# Patient Record
Sex: Female | Born: 1950 | Race: White | Hispanic: No | Smoking: Current every day smoker
Health system: Southern US, Community
[De-identification: ages and names within clinical notes are randomized; demographics above are authoritative.]

## PROBLEM LIST (undated history)

## (undated) DIAGNOSIS — E039 Hypothyroidism, unspecified: Secondary | ICD-10-CM

## (undated) DIAGNOSIS — J449 Chronic obstructive pulmonary disease, unspecified: Secondary | ICD-10-CM

## (undated) DIAGNOSIS — R202 Paresthesia of skin: Secondary | ICD-10-CM

## (undated) DIAGNOSIS — R51 Headache: Secondary | ICD-10-CM

## (undated) DIAGNOSIS — R200 Anesthesia of skin: Secondary | ICD-10-CM

## (undated) DIAGNOSIS — F32A Depression, unspecified: Secondary | ICD-10-CM

## (undated) DIAGNOSIS — I10 Essential (primary) hypertension: Secondary | ICD-10-CM

## (undated) DIAGNOSIS — F329 Major depressive disorder, single episode, unspecified: Secondary | ICD-10-CM

## (undated) DIAGNOSIS — J45909 Unspecified asthma, uncomplicated: Secondary | ICD-10-CM

## (undated) HISTORY — PX: CERVICAL FUSION: SHX112

## (undated) HISTORY — PX: OTHER SURGICAL HISTORY: SHX169

## (undated) HISTORY — PX: CARPAL TUNNEL RELEASE: SHX101

---

## 2013-12-15 ENCOUNTER — Other Ambulatory Visit: Payer: Self-pay | Admitting: Orthopedic Surgery

## 2013-12-22 ENCOUNTER — Encounter (HOSPITAL_COMMUNITY): Payer: Self-pay | Admitting: Pharmacy Technician

## 2013-12-29 ENCOUNTER — Ambulatory Visit (HOSPITAL_COMMUNITY)
Admission: RE | Admit: 2013-12-29 | Discharge: 2013-12-29 | Disposition: A | Discharge status: Home / Self Care | Payer: Worker's Compensation | Source: Ambulatory Visit | Attending: Orthopedic Surgery | Admitting: Orthopedic Surgery

## 2013-12-29 ENCOUNTER — Encounter (HOSPITAL_COMMUNITY): Payer: Self-pay

## 2013-12-29 ENCOUNTER — Encounter (HOSPITAL_COMMUNITY)
Admission: RE | Admit: 2013-12-29 | Discharge: 2013-12-29 | Disposition: A | Discharge status: Home / Self Care | Payer: Worker's Compensation | Source: Ambulatory Visit | Attending: Orthopedic Surgery | Admitting: Orthopedic Surgery

## 2013-12-29 DIAGNOSIS — Z01810 Encounter for preprocedural cardiovascular examination: Secondary | ICD-10-CM | POA: Insufficient documentation

## 2013-12-29 DIAGNOSIS — Z01818 Encounter for other preprocedural examination: Secondary | ICD-10-CM | POA: Diagnosis present

## 2013-12-29 DIAGNOSIS — Z01812 Encounter for preprocedural laboratory examination: Secondary | ICD-10-CM | POA: Insufficient documentation

## 2013-12-29 HISTORY — DX: Anesthesia of skin: R20.2

## 2013-12-29 HISTORY — DX: Unspecified asthma, uncomplicated: J45.909

## 2013-12-29 HISTORY — DX: Major depressive disorder, single episode, unspecified: F32.9

## 2013-12-29 HISTORY — DX: Headache: R51

## 2013-12-29 HISTORY — DX: Hypothyroidism, unspecified: E03.9

## 2013-12-29 HISTORY — DX: Chronic obstructive pulmonary disease, unspecified: J44.9

## 2013-12-29 HISTORY — DX: Essential (primary) hypertension: I10

## 2013-12-29 HISTORY — DX: Depression, unspecified: F32.A

## 2013-12-29 HISTORY — DX: Anesthesia of skin: R20.0

## 2013-12-29 LAB — URINALYSIS, ROUTINE W REFLEX MICROSCOPIC
Bilirubin Urine: NEGATIVE
Glucose, UA: NEGATIVE mg/dL
Hgb urine dipstick: NEGATIVE
KETONES UR: NEGATIVE mg/dL
LEUKOCYTES UA: NEGATIVE
NITRITE: NEGATIVE
PROTEIN: NEGATIVE mg/dL
Specific Gravity, Urine: 1.014 (ref 1.005–1.030)
UROBILINOGEN UA: 0.2 mg/dL (ref 0.0–1.0)
pH: 7 (ref 5.0–8.0)

## 2013-12-29 LAB — TYPE AND SCREEN
ABO/RH(D): A NEG
ANTIBODY SCREEN: NEGATIVE

## 2013-12-29 LAB — CBC WITH DIFFERENTIAL/PLATELET
BASOS ABS: 0.1 10*3/uL (ref 0.0–0.1)
BASOS PCT: 1 % (ref 0–1)
Eosinophils Absolute: 0.1 10*3/uL (ref 0.0–0.7)
Eosinophils Relative: 1 % (ref 0–5)
HCT: 41.2 % (ref 36.0–46.0)
Hemoglobin: 14.2 g/dL (ref 12.0–15.0)
Lymphocytes Relative: 34 % (ref 12–46)
Lymphs Abs: 2.9 10*3/uL (ref 0.7–4.0)
MCH: 31.6 pg (ref 26.0–34.0)
MCHC: 34.5 g/dL (ref 30.0–36.0)
MCV: 91.8 fL (ref 78.0–100.0)
MONO ABS: 0.3 10*3/uL (ref 0.1–1.0)
Monocytes Relative: 4 % (ref 3–12)
NEUTROS ABS: 5.2 10*3/uL (ref 1.7–7.7)
NEUTROS PCT: 60 % (ref 43–77)
Platelets: 262 10*3/uL (ref 150–400)
RBC: 4.49 MIL/uL (ref 3.87–5.11)
RDW: 14.9 % (ref 11.5–15.5)
WBC: 8.6 10*3/uL (ref 4.0–10.5)

## 2013-12-29 LAB — COMPREHENSIVE METABOLIC PANEL
ALBUMIN: 3.9 g/dL (ref 3.5–5.2)
ALT: 7 U/L (ref 0–35)
AST: 12 U/L (ref 0–37)
Alkaline Phosphatase: 87 U/L (ref 39–117)
BUN: 7 mg/dL (ref 6–23)
CO2: 24 mEq/L (ref 19–32)
CREATININE: 0.64 mg/dL (ref 0.50–1.10)
Calcium: 10 mg/dL (ref 8.4–10.5)
Chloride: 98 mEq/L (ref 96–112)
GFR calc Af Amer: >90 mL/min (ref >90)
GFR calc non Af Amer: >90 mL/min (ref >90)
Glucose, Bld: 96 mg/dL (ref 70–99)
POTASSIUM: 4.2 meq/L (ref 3.7–5.3)
Sodium: 137 mEq/L (ref 137–147)
Total Bilirubin: 0.4 mg/dL (ref 0.3–1.2)
Total Protein: 7.5 g/dL (ref 6.0–8.3)

## 2013-12-29 LAB — APTT: aPTT: 26 seconds (ref 24–37)

## 2013-12-29 LAB — ABO/RH: ABO/RH(D): A NEG

## 2013-12-29 LAB — PROTIME-INR
INR: 1.01 (ref 0.00–1.49)
Prothrombin Time: 13.1 seconds (ref 11.6–15.2)

## 2013-12-29 NOTE — Progress Notes (Signed)
Pt chart forwarded to [place], [place] ( anesthesia) to review EKG.

## 2013-12-29 NOTE — Pre-Procedure Instructions (Addendum)
Shelby GoodellJudy Wade  12/29/2013   Your procedure is scheduled on: Thursday, January 06, 2014 at 7:30 AM  [facility] to [facility] (use Main Entrance "A"') at 5:30 AM.  Call this number if you have problems the morning of surgery: [phone]   Remember:   Do not eat food or drink liquids after midnight Wednesday, January 05, 2014   Take these medicines the morning of surgery with A SIP OF WATER:  levothyroxine (SYNTHROID, LEVOTHROID, OxyCODONE, if needed:albuterol (PROVENTIL HFA;VENTOLIN HFA) for wheezing or shortness of breath ( Bring inhaler with you on day of procedure).  Stop taking Aspirin and herbal medications. Do not take any NSAIDs ie: Ibuprofen, Advil, Naproxen or any medication containing Aspirin. Stop 5 days prior to procedure. Saturday 01/01/14   Do not wear jewelry, make-up or nail polish.  Do not wear lotions, powders, or perfumes. You may  NOT wear deodorant.  Do not shave 48 hours prior to surgery.   Do not bring valuables to the hospital.  [facility] is not responsible for any belongings or valuables.               Contacts, dentures or bridgework may not be worn into surgery.  Leave suitcase in the car. After surgery it may be brought to your room.  For patients admitted to the hospital, discharge time is determined by your treatment team.               Patients discharged the day of surgery will not be allowed to drive home.  Name and phone number of your driver:  Special Instructions:  Special Instructions:Special Instructions: [facility] - Preparing for [facility]  Before surgery, you can play an important role.  Because skin is not sterile, your skin needs to be as free of germs as possible.  You can reduce the number of germs on you skin by washing with CHG (chlorahexidine gluconate) soap before surgery.  CHG is an antiseptic cleaner which kills germs and bonds with the skin to continue killing germs even after washing.  Please DO NOT use if you have an allergy to CHG  or antibacterial soaps.  If your skin becomes reddened/irritated stop using the CHG and inform your nurse when you arrive at Short Stay.  Do not shave (including legs and underarms) for at least 48 hours prior to the first CHG shower.  You may shave your face.  Please follow these instructions carefully:   1.  Shower with CHG Soap the night before surgery and the morning of Surgery.  2.  If you choose to wash your hair, wash your hair first as usual with your normal shampoo.  3.  After you shampoo, rinse your hair and body thoroughly to remove the Shampoo.  4.  Use CHG as you would any other liquid soap.  You can apply chg directly  to the skin and wash gently with scrungie or a clean washcloth.  5.  Apply the CHG Soap to your body ONLY FROM THE NECK DOWN.  Do not use on open wounds or open sores.  Avoid contact with your eyes, ears, mouth and genitals (private parts).  Wash genitals (private parts) with your normal soap.  6.  Wash thoroughly, paying special attention to the area where your surgery will be performed.  7.  Thoroughly rinse your body with warm water from the neck down.  8.  DO NOT shower/wash with your normal soap after using and rinsing off the CHG Soap.  9.  Pat yourself dry with a clean towel.            10.  Wear clean pajamas.            11.  Place clean sheets on your bed the night of your first shower and do not sleep with pets.  Day of Surgery  Do not apply any lotions/deodorants the morning of surgery.  Please wear clean clothes to the hospital/surgery center.   Please read over the following fact sheets that you were given: Pain Booklet, Coughing and Deep Breathing, Blood Transfusion Information and Surgical Site Infection Prevention

## 2013-12-30 NOTE — Progress Notes (Signed)
Anesthesia chart review: Patient is a 63 year old female scheduled for left reverse total shoulder on 01/06/14 by Dr. Ave Filterhandler. History includes hypothyroidism, hypertension, COPD, smoking, asthma, depression, headaches "cluster migraines", cervical fusion, bilateral CTR and ulnar nerve repositioning, paresthesias in her extremities. BMI is consistent with obesity.  EKG on 12/29/13 showed NSR with first degree AVB. She reported an echocardiogram in the past, but > 20 years ago.  Preoperative CXR and labs noted.  Anticipate that she can proceed as planned.  Velna Ochsllison Rece Zechman, PA-C [facility]/Anesthesiology Phone [phone] 12/30/2013 12:33 PM

## 2014-01-05 MED ORDER — POVIDONE-IODINE 7.5 % EX SOLN
Freq: Once | CUTANEOUS | Status: DC
  Filled 2014-01-05: qty 118

## 2014-01-05 MED ORDER — VANCOMYCIN HCL IN DEXTROSE 1-5 GM/200ML-% IV SOLN
1000.0000 mg | Freq: Once | INTRAVENOUS | Status: AC
  Administered 2014-01-06: 1000 mg via INTRAVENOUS
  Filled 2014-01-05: qty 200

## 2014-01-05 MED ORDER — CEFAZOLIN SODIUM-DEXTROSE 2-3 GM-% IV SOLR: 2.0000 g | INTRAVENOUS | Status: DC

## 2014-01-05 NOTE — Progress Notes (Signed)
Patient called regarding oxycodone stated she normally takes it at 6p/ 6a. I advised her she could take it at 0500 or 0515 in the morning with a sip of water.

## 2014-01-06 ENCOUNTER — Encounter (HOSPITAL_COMMUNITY): Payer: Worker's Compensation | Admitting: Vascular Surgery

## 2014-01-06 ENCOUNTER — Encounter (HOSPITAL_COMMUNITY)
Admission: RE | Disposition: A | Discharge status: Skilled Nursing Facility | Payer: Self-pay | Source: Ambulatory Visit | Attending: Orthopedic Surgery

## 2014-01-06 ENCOUNTER — Inpatient Hospital Stay (HOSPITAL_COMMUNITY): Payer: Worker's Compensation | Admitting: Anesthesiology

## 2014-01-06 ENCOUNTER — Inpatient Hospital Stay (HOSPITAL_COMMUNITY): Payer: Worker's Compensation

## 2014-01-06 ENCOUNTER — Inpatient Hospital Stay (HOSPITAL_COMMUNITY)
Admission: RE | Admit: 2014-01-06 | Discharge: 2014-01-11 | DRG: 483 | Disposition: A | Discharge status: Skilled Nursing Facility | Payer: Worker's Compensation | Source: Ambulatory Visit | Attending: Orthopedic Surgery | Admitting: Orthopedic Surgery

## 2014-01-06 ENCOUNTER — Encounter (HOSPITAL_COMMUNITY): Payer: Self-pay | Admitting: Surgery

## 2014-01-06 DIAGNOSIS — Z880 Allergy status to penicillin: Secondary | ICD-10-CM

## 2014-01-06 DIAGNOSIS — I10 Essential (primary) hypertension: Secondary | ICD-10-CM | POA: Diagnosis present

## 2014-01-06 DIAGNOSIS — F17200 Nicotine dependence, unspecified, uncomplicated: Secondary | ICD-10-CM | POA: Diagnosis present

## 2014-01-06 DIAGNOSIS — Z8249 Family history of ischemic heart disease and other diseases of the circulatory system: Secondary | ICD-10-CM

## 2014-01-06 DIAGNOSIS — M719 Bursopathy, unspecified: Principal | ICD-10-CM | POA: Diagnosis present

## 2014-01-06 DIAGNOSIS — M75100 Unspecified rotator cuff tear or rupture of unspecified shoulder, not specified as traumatic: Secondary | ICD-10-CM

## 2014-01-06 DIAGNOSIS — Z881 Allergy status to other antibiotic agents status: Secondary | ICD-10-CM

## 2014-01-06 DIAGNOSIS — G43909 Migraine, unspecified, not intractable, without status migrainosus: Secondary | ICD-10-CM | POA: Diagnosis present

## 2014-01-06 DIAGNOSIS — M19019 Primary osteoarthritis, unspecified shoulder: Secondary | ICD-10-CM | POA: Diagnosis present

## 2014-01-06 DIAGNOSIS — E039 Hypothyroidism, unspecified: Secondary | ICD-10-CM | POA: Diagnosis present

## 2014-01-06 DIAGNOSIS — M12819 Other specific arthropathies, not elsewhere classified, unspecified shoulder: Secondary | ICD-10-CM | POA: Diagnosis present

## 2014-01-06 DIAGNOSIS — J4489 Other specified chronic obstructive pulmonary disease: Secondary | ICD-10-CM | POA: Diagnosis present

## 2014-01-06 DIAGNOSIS — J449 Chronic obstructive pulmonary disease, unspecified: Secondary | ICD-10-CM | POA: Diagnosis present

## 2014-01-06 DIAGNOSIS — M67919 Unspecified disorder of synovium and tendon, unspecified shoulder: Principal | ICD-10-CM | POA: Diagnosis present

## 2014-01-06 HISTORY — PX: REVERSE SHOULDER ARTHROPLASTY: SHX5054

## 2014-01-06 SURGERY — ARTHROPLASTY, SHOULDER, TOTAL, REVERSE
Anesthesia: General | Site: Shoulder | Laterality: Left

## 2014-01-06 MED ORDER — PREGABALIN 50 MG PO CAPS
150.0000 mg | ORAL_CAPSULE | Freq: Two times a day (BID) | ORAL | Status: DC
  Administered 2014-01-06 – 2014-01-11 (×10): 150 mg via ORAL
  Filled 2014-01-06 (×11): qty 3

## 2014-01-06 MED ORDER — LISINOPRIL 10 MG PO TABS
10.0000 mg | ORAL_TABLET | Freq: Every day | ORAL | Status: DC
  Administered 2014-01-07 – 2014-01-11 (×3): 10 mg via ORAL
  Filled 2014-01-06 (×6): qty 1

## 2014-01-06 MED ORDER — OXYCODONE HCL 5 MG PO TABS: 5.0000 mg | ORAL_TABLET | Freq: Once | ORAL | Status: DC | PRN

## 2014-01-06 MED ORDER — FENTANYL CITRATE 0.05 MG/ML IJ SOLN
INTRAMUSCULAR | Status: DC | PRN
  Administered 2014-01-06 (×2): 50 ug via INTRAVENOUS

## 2014-01-06 MED ORDER — ACETAMINOPHEN 325 MG PO TABS
650.0000 mg | ORAL_TABLET | Freq: Four times a day (QID) | ORAL | Status: DC | PRN
  Filled 2014-01-06: qty 2

## 2014-01-06 MED ORDER — OXYCODONE-ACETAMINOPHEN 5-325 MG PO TABS
1.0000 | ORAL_TABLET | ORAL | Status: DC | PRN
  Administered 2014-01-06 – 2014-01-11 (×10): 2 via ORAL
  Filled 2014-01-06 (×10): qty 2

## 2014-01-06 MED ORDER — OXYCODONE HCL ER 20 MG PO T12A
60.0000 mg | EXTENDED_RELEASE_TABLET | Freq: Two times a day (BID) | ORAL | Status: DC
  Administered 2014-01-06 – 2014-01-11 (×10): 60 mg via ORAL
  Filled 2014-01-06 (×6): qty 3
  Filled 2014-01-06: qty 6
  Filled 2014-01-06 (×3): qty 3

## 2014-01-06 MED ORDER — OXYCODONE HCL ER 20 MG PO T12A: 60.0000 mg | EXTENDED_RELEASE_TABLET | Freq: Two times a day (BID) | ORAL | Status: DC

## 2014-01-06 MED ORDER — MORPHINE SULFATE 2 MG/ML IJ SOLN
1.0000 mg | INTRAMUSCULAR | Status: DC | PRN
  Administered 2014-01-06 – 2014-01-07 (×3): 1 mg via INTRAVENOUS
  Filled 2014-01-06 (×2): qty 1

## 2014-01-06 MED ORDER — MEPERIDINE HCL 25 MG/ML IJ SOLN: 6.2500 mg | INTRAMUSCULAR | Status: DC | PRN

## 2014-01-06 MED ORDER — DEXTROSE 5 % IV SOLN
10.0000 mg | INTRAVENOUS | Status: DC | PRN
  Administered 2014-01-06: 40 ug/min via INTRAVENOUS

## 2014-01-06 MED ORDER — ALBUTEROL SULFATE (2.5 MG/3ML) 0.083% IN NEBU
2.5000 mg | INHALATION_SOLUTION | Freq: Four times a day (QID) | RESPIRATORY_TRACT | Status: DC | PRN
  Administered 2014-01-06: 2.5 mg via RESPIRATORY_TRACT

## 2014-01-06 MED ORDER — ONDANSETRON HCL 4 MG/2ML IJ SOLN: 4.0000 mg | Freq: Once | INTRAMUSCULAR | Status: DC | PRN

## 2014-01-06 MED ORDER — VANCOMYCIN HCL IN DEXTROSE 1-5 GM/200ML-% IV SOLN
1000.0000 mg | Freq: Two times a day (BID) | INTRAVENOUS | Status: AC
  Administered 2014-01-06: 1000 mg via INTRAVENOUS
  Filled 2014-01-06: qty 200

## 2014-01-06 MED ORDER — LISINOPRIL-HYDROCHLOROTHIAZIDE 10-12.5 MG PO TABS: 1.0000 | ORAL_TABLET | Freq: Every day | ORAL | Status: DC

## 2014-01-06 MED ORDER — ONDANSETRON HCL 4 MG/2ML IJ SOLN
INTRAMUSCULAR | Status: AC
  Filled 2014-01-06: qty 2

## 2014-01-06 MED ORDER — PROPOFOL 10 MG/ML IV BOLUS
INTRAVENOUS | Status: DC | PRN
  Administered 2014-01-06: 140 mg via INTRAVENOUS
  Administered 2014-01-06: 20 mg via INTRAVENOUS

## 2014-01-06 MED ORDER — ROCURONIUM BROMIDE 100 MG/10ML IV SOLN
INTRAVENOUS | Status: DC | PRN
  Administered 2014-01-06: 50 mg via INTRAVENOUS

## 2014-01-06 MED ORDER — HYDROMORPHONE HCL PF 1 MG/ML IJ SOLN: 0.2500 mg | INTRAMUSCULAR | Status: DC | PRN

## 2014-01-06 MED ORDER — NICOTINE 7 MG/24HR TD PT24
7.0000 mg | MEDICATED_PATCH | Freq: Every day | TRANSDERMAL | Status: DC
  Administered 2014-01-06 – 2014-01-11 (×6): 7 mg via TRANSDERMAL
  Filled 2014-01-06 (×6): qty 1

## 2014-01-06 MED ORDER — OXYCODONE HCL ER 60 MG PO T12A: 60.0000 mg | EXTENDED_RELEASE_TABLET | Freq: Two times a day (BID) | ORAL | Status: DC

## 2014-01-06 MED ORDER — PROPOFOL 10 MG/ML IV BOLUS
INTRAVENOUS | Status: AC
Start: 2014-01-06 — End: 2014-01-06
  Filled 2014-01-06: qty 20

## 2014-01-06 MED ORDER — LIDOCAINE HCL (CARDIAC) 20 MG/ML IV SOLN
INTRAVENOUS | Status: AC
  Filled 2014-01-06: qty 5

## 2014-01-06 MED ORDER — NEOSTIGMINE METHYLSULFATE 1 MG/ML IJ SOLN
INTRAMUSCULAR | Status: DC | PRN
  Administered 2014-01-06: 4 mg via INTRAVENOUS

## 2014-01-06 MED ORDER — ALBUTEROL SULFATE (2.5 MG/3ML) 0.083% IN NEBU
2.5000 mg | INHALATION_SOLUTION | Freq: Four times a day (QID) | RESPIRATORY_TRACT | Status: DC | PRN
  Administered 2014-01-09 – 2014-01-10 (×4): 2.5 mg via RESPIRATORY_TRACT
  Filled 2014-01-06 (×5): qty 3

## 2014-01-06 MED ORDER — GLYCOPYRROLATE 0.2 MG/ML IJ SOLN
INTRAMUSCULAR | Status: AC
  Filled 2014-01-06: qty 3

## 2014-01-06 MED ORDER — BISACODYL 10 MG RE SUPP: 10.0000 mg | Freq: Every day | RECTAL | Status: DC | PRN

## 2014-01-06 MED ORDER — MIDAZOLAM HCL 2 MG/2ML IJ SOLN
INTRAMUSCULAR | Status: AC
  Filled 2014-01-06: qty 2

## 2014-01-06 MED ORDER — HYDROCODONE-ACETAMINOPHEN 5-325 MG PO TABS
1.0000 | ORAL_TABLET | ORAL | Status: DC | PRN
  Administered 2014-01-07 – 2014-01-10 (×2): 2 via ORAL
  Filled 2014-01-06 (×2): qty 2

## 2014-01-06 MED ORDER — ALBUTEROL SULFATE HFA 108 (90 BASE) MCG/ACT IN AERS
INHALATION_SPRAY | RESPIRATORY_TRACT | Status: DC | PRN
  Administered 2014-01-06: 2 via RESPIRATORY_TRACT

## 2014-01-06 MED ORDER — FENTANYL CITRATE 0.05 MG/ML IJ SOLN
INTRAMUSCULAR | Status: AC
  Filled 2014-01-06: qty 5

## 2014-01-06 MED ORDER — LIDOCAINE HCL (CARDIAC) 20 MG/ML IV SOLN
INTRAVENOUS | Status: DC | PRN
  Administered 2014-01-06: 50 mg via INTRAVENOUS

## 2014-01-06 MED ORDER — LACTATED RINGERS IV SOLN
INTRAVENOUS | Status: DC | PRN
  Administered 2014-01-06 (×2): via INTRAVENOUS

## 2014-01-06 MED ORDER — SODIUM CHLORIDE 0.9 % IV SOLN
INTRAVENOUS | Status: DC
  Administered 2014-01-06: 100 mL/h via INTRAVENOUS

## 2014-01-06 MED ORDER — OXYCODONE HCL 5 MG/5ML PO SOLN: 5.0000 mg | Freq: Once | ORAL | Status: DC | PRN

## 2014-01-06 MED ORDER — LEVOTHYROXINE SODIUM 175 MCG PO TABS
175.0000 ug | ORAL_TABLET | Freq: Every day | ORAL | Status: DC
  Administered 2014-01-07 – 2014-01-11 (×5): 175 ug via ORAL
  Filled 2014-01-06 (×6): qty 1

## 2014-01-06 MED ORDER — ROCURONIUM BROMIDE 50 MG/5ML IV SOLN
INTRAVENOUS | Status: AC
  Filled 2014-01-06: qty 1

## 2014-01-06 MED ORDER — ONDANSETRON HCL 4 MG/2ML IJ SOLN
INTRAMUSCULAR | Status: DC | PRN
  Administered 2014-01-06: 4 mg via INTRAVENOUS

## 2014-01-06 MED ORDER — POLYETHYLENE GLYCOL 3350 17 G PO PACK
17.0000 g | PACK | Freq: Every day | ORAL | Status: DC | PRN
  Administered 2014-01-08: 17 g via ORAL
  Filled 2014-01-06: qty 1

## 2014-01-06 MED ORDER — ALBUTEROL SULFATE (2.5 MG/3ML) 0.083% IN NEBU
INHALATION_SOLUTION | RESPIRATORY_TRACT | Status: AC
  Administered 2014-01-06: 11:00:00
  Filled 2014-01-06: qty 3

## 2014-01-06 MED ORDER — ONDANSETRON HCL 4 MG/2ML IJ SOLN: 4.0000 mg | Freq: Four times a day (QID) | INTRAMUSCULAR | Status: DC | PRN

## 2014-01-06 MED ORDER — MIDAZOLAM HCL 5 MG/5ML IJ SOLN
INTRAMUSCULAR | Status: DC | PRN
  Administered 2014-01-06 (×2): 1 mg via INTRAVENOUS

## 2014-01-06 MED ORDER — DIPHENHYDRAMINE HCL 12.5 MG/5ML PO ELIX: 12.5000 mg | ORAL_SOLUTION | ORAL | Status: DC | PRN

## 2014-01-06 MED ORDER — DEXTROSE 5 % IV SOLN
INTRAVENOUS | Status: DC | PRN
  Administered 2014-01-06: 08:00:00 via INTRAVENOUS

## 2014-01-06 MED ORDER — HYDROCHLOROTHIAZIDE 12.5 MG PO CAPS
12.5000 mg | ORAL_CAPSULE | Freq: Every day | ORAL | Status: DC
  Administered 2014-01-07 – 2014-01-11 (×4): 12.5 mg via ORAL
  Filled 2014-01-06 (×6): qty 1

## 2014-01-06 MED ORDER — GLYCOPYRROLATE 0.2 MG/ML IJ SOLN
INTRAMUSCULAR | Status: DC | PRN
  Administered 2014-01-06: .15 mg via INTRAVENOUS
  Administered 2014-01-06: 0.6 mg via INTRAVENOUS

## 2014-01-06 MED ORDER — NEOSTIGMINE METHYLSULFATE 1 MG/ML IJ SOLN
INTRAMUSCULAR | Status: AC
  Filled 2014-01-06: qty 10

## 2014-01-06 MED ORDER — OXYCODONE HCL 5 MG PO TABS
5.0000 mg | ORAL_TABLET | ORAL | Status: DC | PRN
  Administered 2014-01-06 – 2014-01-11 (×19): 10 mg via ORAL
  Filled 2014-01-06 (×19): qty 2

## 2014-01-06 MED ORDER — ALUMINUM HYDROXIDE GEL 320 MG/5ML PO SUSP
15.0000 mL | ORAL | Status: DC | PRN
  Filled 2014-01-06: qty 30

## 2014-01-06 MED ORDER — DOCUSATE SODIUM 100 MG PO CAPS
100.0000 mg | ORAL_CAPSULE | Freq: Two times a day (BID) | ORAL | Status: DC
  Administered 2014-01-06 – 2014-01-11 (×11): 100 mg via ORAL
  Filled 2014-01-06 (×14): qty 1

## 2014-01-06 MED ORDER — MENTHOL 3 MG MT LOZG: 1.0000 | LOZENGE | OROMUCOSAL | Status: DC | PRN

## 2014-01-06 MED ORDER — METOCLOPRAMIDE HCL 5 MG/ML IJ SOLN: 5.0000 mg | Freq: Three times a day (TID) | INTRAMUSCULAR | Status: DC | PRN

## 2014-01-06 MED ORDER — ARTIFICIAL TEARS OP OINT
TOPICAL_OINTMENT | OPHTHALMIC | Status: DC | PRN
  Administered 2014-01-06: 1 via OPHTHALMIC

## 2014-01-06 MED ORDER — ASPIRIN EC 325 MG PO TBEC
325.0000 mg | DELAYED_RELEASE_TABLET | Freq: Two times a day (BID) | ORAL | Status: DC
Start: 2014-01-06 — End: 2014-01-11
  Administered 2014-01-06 – 2014-01-11 (×10): 325 mg via ORAL
  Filled 2014-01-06 (×12): qty 1

## 2014-01-06 MED ORDER — SODIUM CHLORIDE 0.9 % IR SOLN
Status: DC | PRN
  Administered 2014-01-06: 3000 mL

## 2014-01-06 MED ORDER — ARTIFICIAL TEARS OP OINT
TOPICAL_OINTMENT | OPHTHALMIC | Status: AC
  Filled 2014-01-06: qty 3.5

## 2014-01-06 MED ORDER — BUPIVACAINE-EPINEPHRINE PF 0.5-1:200000 % IJ SOLN
INTRAMUSCULAR | Status: DC | PRN
  Administered 2014-01-06: 30 mL via PERINEURAL

## 2014-01-06 MED ORDER — METOCLOPRAMIDE HCL 10 MG PO TABS: 5.0000 mg | ORAL_TABLET | Freq: Three times a day (TID) | ORAL | Status: DC | PRN

## 2014-01-06 MED ORDER — PHENOL 1.4 % MT LIQD: 1.0000 | OROMUCOSAL | Status: DC | PRN

## 2014-01-06 MED ORDER — ACETAMINOPHEN 650 MG RE SUPP: 650.0000 mg | Freq: Four times a day (QID) | RECTAL | Status: DC | PRN

## 2014-01-06 MED ORDER — ONDANSETRON HCL 4 MG PO TABS: 4.0000 mg | ORAL_TABLET | Freq: Four times a day (QID) | ORAL | Status: DC | PRN

## 2014-01-06 SURGICAL SUPPLY — 72 items
BIT DRILL 170X2.5X (BIT) IMPLANT
BIT DRL 170X2.5X (BIT)
BLADE SAW SAG 73X25 THK (BLADE) ×1
BLADE SAW SGTL 73X25 THK (BLADE) ×1 IMPLANT
BLADE SURG 15 STRL LF DISP TIS (BLADE) ×1 IMPLANT
BLADE SURG 15 STRL SS (BLADE) ×1
BOWL SMART MIX CTS (DISPOSABLE) IMPLANT
CAPT SHOULD DELTAXTEND CEM MOD ×2 IMPLANT
CHLORAPREP W/TINT 26ML (MISCELLANEOUS) ×2 IMPLANT
CLOSURE STERI-STRIP 1/4X4 (GAUZE/BANDAGES/DRESSINGS) ×2 IMPLANT
COVER SURGICAL LIGHT HANDLE (MISCELLANEOUS) ×2 IMPLANT
DRAPE INCISE IOBAN 66X45 STRL (DRAPES) ×4 IMPLANT
DRAPE SURG 17X23 STRL (DRAPES) ×2 IMPLANT
DRAPE U-SHAPE 47X51 STRL (DRAPES) ×2 IMPLANT
DRILL 2.5 (BIT)
DRILL BIT 5/64 (BIT) ×2 IMPLANT
DRSG MEPILEX BORDER 4X4 (GAUZE/BANDAGES/DRESSINGS) ×2 IMPLANT
DRSG MEPILEX BORDER 4X8 (GAUZE/BANDAGES/DRESSINGS) ×2 IMPLANT
ELECT BLADE 4.0 EZ CLEAN MEGAD (MISCELLANEOUS) ×2
ELECT REM PT RETURN 9FT ADLT (ELECTROSURGICAL) ×2
ELECTRODE BLDE 4.0 EZ CLN MEGD (MISCELLANEOUS) ×1 IMPLANT
ELECTRODE REM PT RTRN 9FT ADLT (ELECTROSURGICAL) ×1 IMPLANT
EVACUATOR 1/8 PVC DRAIN (DRAIN) ×2 IMPLANT
GLOVE BIO SURGEON STRL SZ7 (GLOVE) ×2 IMPLANT
GLOVE BIO SURGEON STRL SZ7.5 (GLOVE) ×2 IMPLANT
GLOVE BIOGEL PI IND STRL 7.0 (GLOVE) ×1 IMPLANT
GLOVE BIOGEL PI IND STRL 8 (GLOVE) ×1 IMPLANT
GLOVE BIOGEL PI INDICATOR 7.0 (GLOVE) ×1
GLOVE BIOGEL PI INDICATOR 8 (GLOVE) ×1
GOWN STRL REUS W/ TWL LRG LVL3 (GOWN DISPOSABLE) ×3 IMPLANT
GOWN STRL REUS W/ TWL XL LVL3 (GOWN DISPOSABLE) ×1 IMPLANT
GOWN STRL REUS W/TWL LRG LVL3 (GOWN DISPOSABLE) ×3
GOWN STRL REUS W/TWL XL LVL3 (GOWN DISPOSABLE) ×1
HANDPIECE INTERPULSE COAX TIP (DISPOSABLE) ×1
HOOD PEEL AWAY FACE SHEILD DIS (HOOD) ×4 IMPLANT
KIT BASIN OR (CUSTOM PROCEDURE TRAY) ×2 IMPLANT
KIT ROOM TURNOVER OR (KITS) ×2 IMPLANT
MANIFOLD NEPTUNE II (INSTRUMENTS) ×2 IMPLANT
NEEDLE HYPO 25GX1X1/2 BEV (NEEDLE) IMPLANT
NEEDLE MAYO TROCAR (NEEDLE) IMPLANT
NOZZLE PRISM 8.5MM (MISCELLANEOUS) IMPLANT
NS IRRIG 1000ML POUR BTL (IV SOLUTION) ×2 IMPLANT
PACK SHOULDER (CUSTOM PROCEDURE TRAY) ×2 IMPLANT
PAD ARMBOARD 7.5X6 YLW CONV (MISCELLANEOUS) ×4 IMPLANT
PIN GUIDE 1.2 (PIN) IMPLANT
PIN GUIDE GLENOPHERE 1.5MX300M (PIN) IMPLANT
PIN METAGLENE 2.5 (PIN) IMPLANT
RETRIEVER SUT HEWSON (MISCELLANEOUS) ×2 IMPLANT
SET HNDPC FAN SPRY TIP SCT (DISPOSABLE) ×1 IMPLANT
SLING ARM IMMOBILIZER LRG (SOFTGOODS) ×2 IMPLANT
SLING ARM IMMOBILIZER MED (SOFTGOODS) IMPLANT
SPONGE LAP 18X18 X RAY DECT (DISPOSABLE) ×2 IMPLANT
SPONGE LAP 4X18 X RAY DECT (DISPOSABLE) IMPLANT
STRIP CLOSURE SKIN 1/2X4 (GAUZE/BANDAGES/DRESSINGS) ×2 IMPLANT
SUCTION FRAZIER TIP 10 FR DISP (SUCTIONS) ×2 IMPLANT
SUPPORT WRAP ARM LG (MISCELLANEOUS) ×2 IMPLANT
SUT ETHIBOND 2 OS 4 DA (SUTURE) ×4 IMPLANT
SUT ETHIBOND NAB CT1 #1 30IN (SUTURE) IMPLANT
SUT FIBERWIRE #2 38 T-5 BLUE (SUTURE)
SUT MNCRL AB 4-0 PS2 18 (SUTURE) ×2 IMPLANT
SUT SILK 2 0 TIES 17X18 (SUTURE)
SUT SILK 2-0 18XBRD TIE BLK (SUTURE) IMPLANT
SUT VIC AB 0 CTB1 27 (SUTURE) ×2 IMPLANT
SUT VIC AB 2-0 CT1 27 (SUTURE) ×1
SUT VIC AB 2-0 CT1 TAPERPNT 27 (SUTURE) ×1 IMPLANT
SUTURE FIBERWR #2 38 T-5 BLUE (SUTURE) IMPLANT
SYR CONTROL 10ML LL (SYRINGE) IMPLANT
SYRINGE TOOMEY DISP (SYRINGE) IMPLANT
TOWEL OR 17X24 6PK STRL BLUE (TOWEL DISPOSABLE) ×2 IMPLANT
TOWEL OR 17X26 10 PK STRL BLUE (TOWEL DISPOSABLE) ×2 IMPLANT
WATER STERILE IRR 1000ML POUR (IV SOLUTION) ×2 IMPLANT
YANKAUER SUCT BULB TIP NO VENT (SUCTIONS) ×2 IMPLANT

## 2014-01-06 NOTE — Anesthesia Procedure Notes (Addendum)
Anesthesia Regional Block:  Interscalene brachial plexus block  Pre-Anesthetic Checklist: ,, timeout performed, Correct Patient, Correct Site, Correct Laterality, Correct Procedure, Correct Position, site marked, Risks and benefits discussed,  Surgical consent,  Pre-op evaluation,  At surgeon's request and post-op pain management  Laterality: Left  Prep: chloraprep       Needles:  Injection technique: Single-shot  Needle Type: Other     Needle Length: 9cm 9 cm Needle Gauge: 22 and 22 G  Needle insertion depth: 5 cm   Additional Needles:  Procedures: nerve stimulator Interscalene brachial plexus block Narrative:  Start time: 01/06/2014 7:05 AM End time: 01/06/2014 7:20 AM Injection made incrementally with aspirations every 5 mL.  Performed by: Personally   Additional Notes: Monitors applied. Patient sedated. Sterile prep and drape,hand hygiene and sterile gloves were used. Needle position confirmed with evoked response at 0.4 mV.Local anesthetic injected incrementally after negative aspiration.Vascular puncture avoided. No complications. The patient tolerated the procedure well.        Procedure Name: Intubation Date/Time: 01/06/2014 7:47 AM Performed by: Marni GriffonJAMES, Holston Oyama B Pre-anesthesia Checklist: Patient identified, Emergency Drugs available, Suction available and Patient being monitored Patient Re-evaluated:Patient Re-evaluated prior to inductionOxygen Delivery Method: Circle system utilized Preoxygenation: Pre-oxygenation with 100% oxygen Intubation Type: IV induction Ventilation: Mask ventilation without difficulty Laryngoscope Size: Mac and 3 Tube type: Oral Tube size: 7.5 mm Number of attempts: 1 Airway Equipment and Method: Stylet Placement Confirmation: ETT inserted through vocal cords under direct vision,  breath sounds checked- equal and bilateral and positive ETCO2 Secured at: 21 (cm at gum) cm Tube secured with: Tape Dental Injury: Teeth and Oropharynx as  per pre-operative assessment

## 2014-01-06 NOTE — Anesthesia Preprocedure Evaluation (Addendum)
Anesthesia Evaluation  Patient identified by MRN, date of birth, ID band Patient awake    Reviewed: Allergy & Precautions, H&P , NPO status , Patient's Chart, lab work & pertinent test results, reviewed documented beta blocker date and time   Airway Mallampati: I TM Distance: >3 FB Neck ROM: Full    Dental  (+) Edentulous Upper, Edentulous Lower, Dental Advisory Given   Pulmonary asthma , COPD COPD inhaler, Current Smoker,          Cardiovascular hypertension, Pt. on medications     Neuro/Psych Depression    GI/Hepatic   Endo/Other  Hypothyroidism   Renal/GU      Musculoskeletal   Abdominal   Peds  Hematology   Anesthesia Other Findings   Reproductive/Obstetrics                         Anesthesia Physical Anesthesia Plan  ASA: II  Anesthesia Plan: General   Post-op Pain Management:    Induction: Intravenous  Airway Management Planned: Oral ETT  Additional Equipment:   Intra-op Plan:   Post-operative Plan: Extubation in OR  Informed Consent: I have reviewed the patients History and Physical, chart, labs and discussed the procedure including the risks, benefits and alternatives for the proposed anesthesia with the patient or authorized representative who has indicated his/her understanding and acceptance.     Plan Discussed with: CRNA and Surgeon  Anesthesia Plan Comments:         Anesthesia Quick Evaluation

## 2014-01-06 NOTE — Transfer of Care (Signed)
Immediate Anesthesia Transfer of Care Note  Patient: Shelby GoodellJudy Pechacek  Procedure(s) Performed: Procedure(s) with comments: REVERSE SHOULDER ARTHROPLASTY (Left) - Left reverse total shoulder  Patient Location: PACU  Anesthesia Type:General  Level of Consciousness: awake, alert , oriented and patient cooperative  Airway & Oxygen Therapy: Patient Spontanous Breathing and Patient connected to nasal cannula oxygen  Post-op Assessment: Report given to PACU RN, Post -op Vital signs reviewed and stable and Patient moving all extremities  Post vital signs: Reviewed and stable  Complications: No apparent anesthesia complications

## 2014-01-06 NOTE — H&P (Signed)
Lorrin GoodellJudy Wade is an 63 y.o. female.   Chief Complaint: L shoulder pain and dysfunction  HPI: Fall at work with L shoulder dislocation, massive RCT.  Irrepairable tear with early DJD. Pain and dysfunction interfere with quality of life.  Past Medical History  Diagnosis Date  . Hypertension   . Asthma   . Hypothyroidism   . COPD (chronic obstructive pulmonary disease)   . Depression   . Headache(784.0)     PMH :  cluster migraines  . Numbness and tingling     in all extremities    Past Surgical History  Procedure Laterality Date  . Cervical fusion    . Carpal tunnel release      bilateral  . Ulner nerve repositioning      Family History  Problem Relation Age of Onset  . Cancer - Other Mother   . Hypertension Mother   . Dementia Mother   . Glaucoma Mother    Social History:  reports that she has been smoking Cigarettes.  She has been smoking about 1.00 pack per day. She has never used smokeless tobacco. She reports that she does not drink alcohol or use illicit drugs.  Allergies:  Allergies  Allergen Reactions  . Erythromycin Hives  . Penicillins Hives    Medications Prior to Admission  Medication Sig Dispense Refill  . albuterol (PROVENTIL HFA;VENTOLIN HFA) 108 (90 BASE) MCG/ACT inhaler Inhale 2 puffs into the lungs every 6 (six) hours as needed for wheezing or shortness of breath.      . levothyroxine (SYNTHROID, LEVOTHROID) 175 MCG tablet Take 175 mcg by mouth daily before breakfast.      . lisinopril-hydrochlorothiazide (PRINZIDE,ZESTORETIC) 10-12.5 MG per tablet Take 1 tablet by mouth daily.      . OxyCODONE HCl ER (OXYCONTIN) 60 MG T12A Take 60 mg by mouth 2 (two) times daily.      . pregabalin (LYRICA) 150 MG capsule Take 150 mg by mouth 2 (two) times daily.        No results found for this or any previous visit (from the past 48 hour(s)). No results found.  Review of Systems  All other systems reviewed and are negative.   Blood pressure 125/72, pulse 74,  temperature 98.6 F (37 C), temperature source Oral, resp. rate 20, weight 81.647 kg (180 lb), SpO2 99.00%. Physical Exam  Constitutional: She is oriented to person, place, and time. She appears well-developed and well-nourished.  HENT:  Head: Atraumatic.  Eyes: EOM are normal.  Cardiovascular: Intact distal pulses.   Respiratory: Effort normal.  Musculoskeletal:  L shoulder pain and weakness, NVID  Neurological: She is alert and oriented to person, place, and time.  Skin: Skin is warm and dry.  Psychiatric: She has a normal mood and affect.     Assessment/Plan L shoulder rotator cuff tear arthropathy Plan L reverse TSA Risks / benefits of surgery discussed Consent on chart  NPO for OR Preop antibiotics   Shelby Wade 01/06/2014, 7:03 AM

## 2014-01-06 NOTE — Op Note (Signed)
Procedure(s): REVERSE SHOULDER ARTHROPLASTY Procedure Note  Shelby GoodellJudy Wade female 63 y.o. 01/06/2014  Procedure(s) and Anesthesia Type:    *LEFT REVERSE SHOULDER ARTHROPLASTY - Choice   Indications:  63 y.o. female  With severe left shoulder arthritis with irrepairable rotator cuff tear. Pain and dysfunction interfered with quality of life and nonoperative treatment with activity modification, NSAIDS and injections failed.     Surgeon: Mable ParisJustin William Letita Prentiss   Assistants: Damita Lackanielle Lalibert PA-C [facility](Danielle was present and scrubbed throughout the procedure and was essential in positioning, retraction, exposure, and closure)  Anesthesia: General endotracheal anesthesia    Procedure Detail  REVERSE SHOULDER ARTHROPLASTY   Estimated Blood Loss:  200 mL  Drains: 1 medium hemovac  Blood Given: none          Specimens: none        Complications:  * No complications entered in OR log *         Disposition: PACU - hemodynamically stable.         Condition: stable      OPERATIVE FINDINGS:  A DePuy press-fit reverse total shoulder arthroplasty was placed with a  size 12 stem, a 38 eccentric glenosphere, and a +3-mm poly insert. The base plate  fixation was very good. Bone quality was moderate.  PROCEDURE: The patient was identified in the preoperative holding area  where I personally marked the operative site after verifying site, side,  and procedure with the patient. An interscalene block given by  the attending anesthesiologist in the holding area and the patient was taken back to the operating room where all extremities were  carefully padded in position after general anesthesia was induced. She  was placed in a beach-chair position and the operative upper extremity was  prepped and draped in a standard sterile fashion. An approximately 10-  cm incision was made from the tip of the coracoid process to the center  point of the humerus at the level of the axilla.  Dissection was carried  down through subcutaneous tissues to the level of the cephalic vein  which was taken laterally with the deltoid. The pectoralis major was  retracted medially. The subdeltoid space was developed and the lateral  edge of the conjoined tendon was identified. The undersurface of  conjoined tendon was palpated and the musculocutaneous nerve was not in  the field. Retractor was placed underneath the conjoined and second  retractor was placed lateral into the deltoid. The circumflex humeral  artery and vessels were identified and clamped and coagulated. The  biceps tendon was dislocated medially. It was taken off of the supraglenoid tubercle and the proximal end was resected.  The subscapularis was completely torn but minimally retracted.  The  joint was then gently externally rotated while the capsule was released  from the humeral neck around to just beyond the 6 o'clock position. At  this point, the joint was dislocated and the humeral head was presented  into the wound. The excessive osteophyte formation was removed with a  large rongeur and the intramedullary canal was then entered with  sequential reamers up to a 12-mm reamer which was felt to be the  appropriate size. The cutting guide was used to make the appropriate  head cut and the head was saved potentially bone grafting.  The proximal metaphyseal reaming guide was then placed and the appropriate size reamer was selected. The metaphyseal bone was then reamed. The trial implant was then placed. The trial was then left in place while the  glenoid was exposed with the arm in an  abducted extended position. The anterior and posterior labrum were  completely excised and the capsule was released circumferentially to  allow for exposure of the glenoid for preparation. The guidepin was  placed using the guide in neutral angulation and the reamer was  placed over the guidepin to ream down to concentric surface. Superior   hand reamer was used as well. The central drill hole was then made and  stayed within the glenoid vault. The Metaglene was then impacted with  Excellent press fit. The superior and inferior screws were then  drilled, measured, and filled with appropriate-sized locking screw  alternatively tightening top and bottom to bring the Metaglene down  tightly against the bone. Posterior and anterior screws were then placed. Fixation was very good.  The glenosphere was then impacted over the [facility] taper and tightened down  with the screw with the eccentric portion inferiorly.  The proximal humerus was then again exposed and the metaphyseal reamer was used to prepare the proximal humerus with a #2 reamer. The trial implant was then  placed in the proximal humerus and the poly trials were tested and the above implant was felt to be the most appropriate soft tissue tensioning with excellent stability and  excellent range of motion. Therefore, final humeral stem was placed  press-fit .  And then the trial polyethylene inserts were tested again and the above implant was felt to be the most appropriate for final insertion. The joint was reduced taken through full range of motion and felt to be stable. Soft tissue tension was appropriate. A medium Hemovac drain was placed out underneath the deltoid prior to closure. The joint was then copiously irrigated with pulse  lavage and the wound was then closed. The subscapularis  was repaired to the medial aspect of the lesser tuberosity with 2 Ethibonds through bone tunnels.  Skin was closed with 2-0 Vicryl in a deep dermal layer and 4-0  Monocryl for skin closure. Steri-Strips were applied. Sterile  dressings were then applied as well as a sling. The patient was allowed  to awaken from general anesthesia, transferred to stretcher, and taken  to recovery room in stable condition.   POSTOPERATIVE PLAN: The patient will be kept in the hospital postoperatively  for pain  control and therapy.

## 2014-01-07 ENCOUNTER — Encounter (HOSPITAL_COMMUNITY): Payer: Self-pay | Admitting: Orthopedic Surgery

## 2014-01-07 LAB — CBC
HCT: 34.7 % — ABNORMAL LOW (ref 36.0–46.0)
HEMOGLOBIN: 11.9 g/dL — AB (ref 12.0–15.0)
MCH: 31.5 pg (ref 26.0–34.0)
MCHC: 34.3 g/dL (ref 30.0–36.0)
MCV: 91.8 fL (ref 78.0–100.0)
Platelets: 243 10*3/uL (ref 150–400)
RBC: 3.78 MIL/uL — AB (ref 3.87–5.11)
RDW: 14.5 % (ref 11.5–15.5)
WBC: 12.3 10*3/uL — ABNORMAL HIGH (ref 4.0–10.5)

## 2014-01-07 MED ORDER — DOCUSATE SODIUM 100 MG PO CAPS: 100.0000 mg | ORAL_CAPSULE | Freq: Three times a day (TID) | ORAL | Status: AC | PRN

## 2014-01-07 MED ORDER — OXYCODONE-ACETAMINOPHEN 5-325 MG PO TABS: 1.0000 | ORAL_TABLET | ORAL | Status: AC | PRN

## 2014-01-07 NOTE — Progress Notes (Signed)
PATIENT ID: Shelby GoodellJudy Wade   1 Day Post-Op Procedure(s) (LRB): REVERSE SHOULDER ARTHROPLASTY (Left)  Subjective: In some pain this am, reports difficultly getting pain under control after block wore off last night. Reports improvement and feeling better this am. She feels like she can go home today. No other complaints or concerns.   Objective:  Filed Vitals:   01/07/14 0503  BP: 114/84  Pulse:   Temp: 98.1 F (36.7 C)  Resp: 16     Awake, alert, orientated L UE drain removed, dressing c/d/i Wiggles fingers, distally NVI Activates deltoid  Labs:   Recent Labs  01/07/14 0355  HGB 11.9*   Recent Labs  01/07/14 0355  WBC 12.3*  RBC 3.78*  HCT 34.7*  PLT 243  No results found for this basename: NA, K, CL, CO2, BUN, CREATININE, GLUCOSE, CALCIUM,  in the last 72 hours  Assessment and Plan: 1 day s/p reverse TSA Sling PT for hand, wrist, elbow ROM only, sling edu Continue home oxycontin with percocet as needed for breakthrough pain, will give percocet script for home pain control, signed and in chart Okay to d/c home today if pain under control after PT Follow up with Dr. Ave Filterhandler in 2 weeks  VTE proph: ASA 325mg  BID, SCDs

## 2014-01-07 NOTE — Discharge Instructions (Signed)
Discharge Instructions after Reverse Total Shoulder Arthroplasty ° ° °A sling has been provided for you. You are to where this at all times, even while sleeping, until your first post operative visit with Dr. Chandler. °Use ice on the shoulder intermittently over the first 48 hours after surgery.  °Pain medicine has been prescribed for you.  °Use your medicine liberally over the first 48 hours, and then you can begin to taper your use. You may take Extra Strength Tylenol or Tylenol only in place of the pain pills. DO NOT take ANY nonsteroidal anti-inflammatory pain medications: Advil, Motrin, Ibuprofen, Aleve, Naproxen or Naprosyn.  °Take one aspirin a day for 2 weeks after surgery, unless you have an aspirin sensitivity/allergy or asthma.  °You may remove your dressing after two days  °You may shower 5 days after surgery. The incisions CANNOT get wet prior to 5 days. Simply allow the water to wash over the site and then pat dry. Do not rub the incisions. Make sure your axilla (armpit) is completely dry after showering. ° ° ° °Please call [phone] during normal business hours or [phone] after hours for any problems. Including the following: ° °- excessive redness of the incisions °- drainage for more than 4 days °- fever of more than 101.5 F ° °*Please note that pain medications will not be refilled after hours or on weekends. ° ° ° ° °

## 2014-01-07 NOTE — Anesthesia Postprocedure Evaluation (Signed)
Anesthesia Post Note  Patient: Shelby Wade  Procedure(s) Performed: Procedure(s) (LRB): REVERSE SHOULDER ARTHROPLASTY (Left)  Anesthesia type: general  Patient location: PACU  Post pain: Pain level controlled  Post assessment: Patient's Cardiovascular Status Stable  Last Vitals:  Filed Vitals:   01/07/14 0503  BP: 114/84  Pulse:   Temp: 36.7 C  Resp: 16    Post vital signs: Reviewed and stable  Level of consciousness: sedated  Complications: No apparent anesthesia complications

## 2014-01-07 NOTE — Care Management Note (Signed)
CARE MANAGEMENT NOTE 01/07/2014  Patient:  Shelby GoodellBARTLETT,Ranessa   Account Number:  [id number]  Date Initiated:  01/07/2014  Documentation initiated by:  Vance PeperBRADY,Camdyn Beske  Subjective/Objective Assessment:   63 yr old female s/p left reverse shoulder arthroplasty.     Action/Plan:   Case Manager spoke with patient. [facility]Patient has balance issues per therapy. Will have no assistance at discharge.   Anticipated DC Date:  01/08/2014   Anticipated DC Plan:  SKILLED NURSING FACILITY  In-house referral  Clinical Social Worker      DC Planning Services  CM consult      Choice offered to / List presented to:             Status of service:  In process, will continue to follow Medicare Important Message given?   (If response is "NO", the following Medicare IM given date fields will be blank) Date Medicare IM given:   Date Additional Medicare IM given:    Discharge Disposition:    Per UR Regulation:    If discussed at Long Length of Stay Meetings, dates discussed:    Comments:  01/07/14 11:25am Vance PeperSusan Archibald Marchetta, RN BSN Case Manager [phone] Patient is under worker's comp. Nurse Case Manager is Albin Fellingngela Holbrook [phone], Fax [phone]. Case Manager called Marylene Landngela this morning to inform her of patient's need for shortterm rehab.

## 2014-01-07 NOTE — Progress Notes (Signed)
Occupational Therapy Treatment Patient Details Name: Shelby GoodellJudy Wade MRN: [id number] DOB: 03-20-1951 Today's Date: 01/07/2014    History of present illness LEFT REVERSE SHOULDER ARTHROPLASTY    OT comments  This 63 yo admitted for above presents to acute OT this treatment session with increased pain, able to perform elbow exercises with S, need to continue to problem solve and practice with best technique for UBB. Will benefit from follow up OT at SNF.  Follow Up Recommendations  SNF    Equipment Recommendations  3 in 1 bedside comode;Tub/shower seat       Precautions / Restrictions Precautions Precautions: Shoulder;Fall Shoulder Interventions: Off for dressing/bathing/exercises;At all times Required Braces or Orthoses: Sling Restrictions Weight Bearing Restrictions: Yes LUE Weight Bearing: Non weight bearing       Mobility Bed Mobility               General bed mobility comments: Pt up in recliner upon arrival  Transfers Overall transfer level: Needs assistance Equipment used: Straight cane Transfers: Sit to/from Stand Sit to Stand: Min guard              Balance     Standing balance support: During functional activity;Single extremity supported Standing balance-Leahy Scale: Poor                     ADL Overall ADL's : Needs assistance/impaired     Upper Body Bathing: Moderate assistance;Sitting Upper Body Bathing Details (indicate cue type and reason): Tried in standing but pt cannot maintain balance and lean out to her left side to let gravity bring her left arm away from her body to be able to wash, apply deodorant, or put shirt on LUE without holding on with RUE on something to steady herself. So I had her attempt this in sitting--also having difficulty     Lower Body Dressing: Maximal assistance;Sit to/from stand Lower Body Dressing Details (indicate cue type and reason): Pt reports she normally crosses one leg over the other do apply her  socks and shoes at home, she cannot currently do this to do pain in her LUE (jolts arm when trying to get her legs crossed)                        Cognition   Behavior [facility] for tasks assessed/performed Overall Cognitive Status: Within Functional Limits for tasks assessed                         Exercises Correct positioning of sling/immobilizer: Maximal assistance  ROM for elbow, wrist and digits of operated UE: Supervision/safety (10 reps in supported standing)    Shoulder Instructions Shoulder Instructions Correct positioning of sling/immobilizer: Maximal assistance ROM for elbow, wrist and digits of operated UE: Supervision/safety (10 reps in supported standing)            Pertinent Vitals/ Pain       9/10 LUE shoulder; repositioned and made RN aware     Prior Functioning/Environment Level of Independence: Independent            Frequency Min 3X/week     Progress Toward Goals  OT Goals(current goals can now be found in the care plan section)  Progress towards OT goals: Progressing toward goals (slowly)  Acute Rehab OT Goals Patient Stated Goal: to be able to take care of myself OT Goal Formulation: With patient Time For Goal Achievement: 01/14/14 Potential to Achieve  Goals: Good ADL Goals Pt Will Perform Grooming: with set-up;with supervision;standing Pt Will Perform Upper Body Bathing: with set-up;with supervision;sitting;standing Pt Will Perform Lower Body Bathing: with set-up;with supervision;sit to/from stand Pt Will Perform Upper Body Dressing: with set-up;with supervision;sitting;standing Pt Will Perform Lower Body Dressing: with set-up;with supervision;sit to/from stand Pt Will Transfer to Toilet: with supervision;ambulating;bedside commode (over toliet) Pt Will Perform Toileting - Clothing Manipulation and hygiene: with supervision;sit to/from stand Pt/caregiver will Perform Home Exercise Program: Left upper  extremity;With written HEP provided;With Supervision (per Dr. Veda Canninghandler's instructions) Additional ADL Goal #1: Pt will S for in and OOB for BADLs  Plan Discharge plan remains appropriate       End of Session Equipment Utilized During Treatment:  [facility])   Activity Tolerance Patient limited by pain   Patient Left in chair;with call bell/phone within reach   Nurse Communication Mobility status        Time: 9604-54091041-1053 OT Time Calculation (min): 12 min  Charges: OT General Charges $OT Visit: 1 Procedure OT Evaluation $Initial OT Evaluation Tier I: 1 Procedure OT Treatments $Self Care/Home Management : 23-37 mins $Therapeutic Exercise: 8-22 mins  Evette GeorgesCatherine Eva Abigayl Hor [phone] 01/07/2014, 11:10 AM

## 2014-01-07 NOTE — Evaluation (Signed)
Occupational Therapy Evaluation Patient Details Name: Shelby GoodellJudy Sportsman MRN: [id number] DOB: 02-13-1951 Today's Date: 01/07/2014    History of Present Illness LEFT REVERSE SHOULDER ARTHROPLASTY    Clinical Impression   This 63 yo female independent pta admitted and had above procedure presents to acute OT with increased pain, decreased balance, decreased use of LUE all affecting pt's ability to care for herself and only has 61 yo grandson at home (who is in school during the day) to help her. Do not feel she can safely be at home at this time thus I am recommending ST[facility]NF for pt to be able to get to a Mod I level before going home.    Follow Up Recommendations  SNF    Equipment Recommendations  3 in 1 bedside comode;Tub/shower seat [facility]C)       Precautions / Restrictions Precautions Precautions: Shoulder;Fall Shoulder Interventions: Off for dressing/bathing/exercises;At all times Required Braces or Orthoses: Sling Restrictions Weight Bearing Restrictions: Yes LUE Weight Bearing: Non weight bearing      Mobility Bed Mobility               General bed mobility comments: Pt up in recliner upon arrival  Transfers Overall transfer level: Needs assistance Equipment used: Straight cane Transfers: Sit to/from Stand Sit to Stand: Min guard              Balance Overall balance assessment: Needs assistance Sitting-balance support: Single extremity supported;Feet supported Sitting balance-Leahy Scale: Good     Standing balance support: Single extremity supported;During functional activity Standing balance-Leahy Scale: Poor                              ADL Overall ADL's : Needs assistance/impaired Eating/Feeding: Set up   Grooming: Minimal assistance;Standing   Upper Body Bathing: Maximal assistance;Sitting;Standing   Lower Body Bathing: Maximal assistance;Sit to/from stand   Upper Body Dressing : Total assistance;Sitting;Standing   Lower Body  Dressing: Maximal assistance;Sit to/from stand   Toilet Transfer: Minimal assistance;Ambulation;Comfort height toilet   Toileting- Clothing Manipulation and Hygiene: Minimal assistance;Sit to/from stand       Functional mobility during ADLs: Minimal assistance;Cane                 Pertinent Vitals/Pain 9/10 LUE; repositioned and made RN aware     Hand Dominance Right   Extremity/Trunk Assessment Upper Extremity Assessment Upper Extremity Assessment: LUE deficits/detail LUE Deficits / Details: shoulder sx this admission; have not attempted elbow distal ROM as of yet due to pt's increased pain of 9/10) LUE Coordination: decreased gross motor           Communication Communication Communication: No difficulties   Cognition Arousal/Alertness: Awake/alert Behavior During Therapy: WFL for tasks assessed/performed Overall Cognitive Status: Within Functional Limits for tasks assessed                           Shoulder Instructions Shoulder Instructions Correct positioning of sling/immobilizer: Maximal assistance Pendulum exercises (written home exercise program):  (NA) Dressing change:  (NA) Positioning of UE while sleeping: Maximal assistance    Home Living Family/patient expects to be discharged to:: Private residence Living Arrangements: Other relatives 52(15 yo grandson) Available Help at Discharge: Family;Available PRN/intermittently Type of Home: Apartment Home Access: Stairs to enter Entrance Stairs-Number of Steps: 3 Entrance Stairs-Rails: Left Home Layout: One level     Bathroom Shower/Tub: Tub/shower unit;Door Shower/tub characteristics: Sport and exercise psychologistDoor Bathroom Toilet:  Standard     Home Equipment: None          Prior Functioning/Environment Level of Independence: Independent             OT Diagnosis: Generalized weakness;Acute pain   OT Problem List: Decreased strength;Decreased range of motion;Impaired balance (sitting and/or  standing);Pain;Impaired UE functional use;Decreased knowledge of precautions;Decreased knowledge of use of DME or AE;Obesity   OT Treatment/Interventions: Self-care/ADL training;Patient/family education;Therapeutic exercise;Therapeutic activities;DME and/or AE instruction;Balance training    OT Goals(Current goals can be found in the care plan section) Acute Rehab OT Goals Patient Stated Goal: to be able to take care of myself OT Goal Formulation: With patient Time For Goal Achievement: 01/14/14 Potential to Achieve Goals: Good  OT Frequency: Min 3X/week   Barriers to D/C: Decreased caregiver support             End of Session Equipment Utilized During Treatment: Gait belt [facility]C) Nurse Communication: Mobility status (Pt needs a PT consult and recommend SNF)  Activity Tolerance: Patient tolerated treatment well Patient left: in chair;with call bell/phone within reach   Time: 0816-0850 OT Time Calculation (min): 34 min Charges:  OT General Charges $OT Visit: 1 Procedure OT Evaluation $Initial OT Evaluation Tier I: 1 Procedure OT Treatments $Self Care/Home Management : 23-37 mins  Evette GeorgesCatherine Eva Tameya Kuznia [phone] 01/07/2014, 9:09 AM

## 2014-01-07 NOTE — Progress Notes (Addendum)
CSW (Clinical Child psychotherapistocial Worker) [place] [facility] [place] Full assessment to follow.   ADDENDUM: CSW spoke with [facility] and they are potentially able to offer placement. CSW informed worker's comp case Psychologist, clinicalmanager Angela and asked to please go ahead and call facility.   CSW received call back from case manager Marylene Landngela and was informed that she is not able to work out payment with facility today. No one from worker's comp will be able to help over the weekend. CSW to leave handoff for weekend CSW but likely pt will not be able to dc to SNF this weekend.  Shelby Wade, LCSWA [phone]

## 2014-01-07 NOTE — Progress Notes (Signed)
Occupational Therapy Treatment Patient Details Name: Lorrin GoodellJudy Husmann MRN: [id number] DOB: 10/22/1950 Today's Date: 01/07/2014    History of present illness LEFT REVERSE SHOULDER ARTHROPLASTY    OT comments  This 63 yo female making progress, will continue to benefit from acute OT with follow up at SNF.  Follow Up Recommendations  SNF    Equipment Recommendations  3 in 1 bedside comode;Tub/shower seat       Precautions / Restrictions Precautions Precautions: Shoulder;Fall Shoulder Interventions: Off for dressing/bathing/exercises;At all times Required Braces or Orthoses: Sling Restrictions Weight Bearing Restrictions: Yes LUE Weight Bearing: Non weight bearing       Mobility Bed Mobility               General bed mobility comments: Pt up in bathroom on arrival  Transfers Overall transfer level: Needs assistance Equipment used: Straight cane Transfers: Sit to/from Stand Sit to Stand: Min guard                ADL Overall ADL's : Needs assistance/impaired                Toilet Transfer: Min guard;Ambulation;Comfort height toilet;Grab bars   Toileting- Clothing Manipulation and Hygiene: Minimal assistance;Sit to/from stand       Functional mobility during ADLs: Min guard;Cane                  Cognition   Behavior During Therapy: WFL for tasks assessed/performed Overall Cognitive Status: Within Functional Limits for tasks assessed                         Exercises Other Exercises Other Exercises: Pt performed 10 reps of AROM of elbow ROM for elbow, wrist and digits of operated UE: Supervision/safety (10 reps in supported standing)             Home Living Family/patient expects to be discharged to:: Private residence Living Arrangements: Alone Available Help at Discharge: Other (Comment) (15 y.o. grandson available on weekends) Type of Home: Apartment Home Access: Stairs to enter Secretary/administratorntrance Stairs-Number of Steps:  3 Entrance Stairs-Rails: Left Home Layout: One level               Home Equipment: None          Prior Functioning/Environment Level of Independence: Independent        Comments: Pt reports history of falls, including a fall that she states caused her to injure her shoulder.    Frequency Min 3X/week     Progress Toward Goals  OT Goals(current goals can now be found in the care plan section)  Progress towards OT goals: Progressing toward goals  Acute Rehab OT Goals Patient Stated Goal: to be able to take care of myself  Plan Discharge plan remains appropriate       End of Session Equipment Utilized During Treatment:  [facility])   Activity Tolerance Patient tolerated treatment well   Patient Left in chair;with call bell/phone within reach   Nurse Communication Patient requests pain meds (wanted to use a phone to call her daughter)        Time: 4782-95621404-1413 OT Time Calculation (min): 9 min  Charges: OT General Charges $OT Visit: 1 Procedure OT Treatments $Therapeutic Exercise: 8-22 mins  Evette GeorgesCatherine Eva Ethaniel Garfield [phone] 01/07/2014, 2:46 PM

## 2014-01-07 NOTE — Evaluation (Signed)
Physical Therapy Evaluation Patient Details Name: Shelby GoodellJudy Wade MRN: [id number] DOB: 30-Jan-1951 Today's Date: 01/07/2014   History of Present Illness  LEFT REVERSE SHOULDER ARTHROPLASTY   Clinical Impression  Patient is seen for the procedure listed above resulting in functional limitations due to the deficits listed below (see PT Problem List). Per pt, she has had several falls resulting in a shoulder injury and has not been able to work since February. Pt with decreased sensation in right foot today however pt states that both legs have decreased sensation intermittently, causing her to trip over her own feet at times. Pt lives alone and will not have help at home to safely mobilize. Patient will benefit from skilled PT to increase their independence and safety with mobility to allow discharge to the venue listed below.      Follow Up Recommendations Other (comment) (Agree with OT recommendation of SNF)    Equipment Recommendations  Cane    Recommendations for Other Services OT consult     Precautions / Restrictions Precautions Precautions: Shoulder;Fall Shoulder Interventions: Off for dressing/bathing/exercises;At all times Required Braces or Orthoses: Sling Restrictions Weight Bearing Restrictions: Yes LUE Weight Bearing: Non weight bearing      Mobility  Bed Mobility               General bed mobility comments: Pt up in recliner upon arrival  Transfers Overall transfer level: Needs assistance Equipment used: Rolling walker (2 wheeled) Transfers: Sit to/from Stand Sit to Stand: Min guard         General transfer comment: Pt requires cues for hand placement and technique to stand safely with single point cane. Uses posterior knee on chair for support in standing with posterior lean.  Ambulation/Gait Ambulation/Gait assistance: Min guard Ambulation Distance (Feet): 125 Feet Assistive device: Straight cane Gait Pattern/deviations: Step-to pattern;Step-through  pattern;Drifts right/left     General Gait Details: Pt ambulates with single point cane. Occasionally hits R foot on cane but able to maintain balance without physical assistance. She does report a history of falls. Tends to drift right and left, mildly unstable.  Stairs            Wheelchair Mobility    Modified Rankin (Stroke Patients Only)       Balance Overall balance assessment: Needs assistance;History of Falls Sitting-balance support: No upper extremity supported;Feet supported Sitting balance-Leahy Scale: Good     Standing balance support: Single extremity supported;During functional activity Standing balance-Leahy Scale: Poor Standing balance comment: Uses posterior knees for balance upon standing to press against bed/chair     Tandem Stance - Right Leg: 15 (Needs physical assist for balance with eyes closed) Tandem Stance - Left Leg: 30 Rhomberg - Eyes Opened: 30 Rhomberg - Eyes Closed: 30                 Pertinent Vitals/Pain Pt reports her pain is "hard to get under control" - States nurse has recently provided pain medication. Pt repositioned in chair for comfort     Home Living Family/patient expects to be discharged to:: Private residence Living Arrangements: Alone Available Help at Discharge: Other (Comment) (15 y.o. grandson available on weekends) Type of Home: Apartment Home Access: Stairs to enter Entrance Stairs-Rails: Left Entrance Stairs-Number of Steps: 3 Home Layout: One level Home Equipment: None      Prior Function Level of Independence: Independent         Comments: Pt reports history of falls, including a fall that she states caused her to  injure her shoulder.      Hand Dominance   Dominant Hand: Right    Extremity/Trunk Assessment   Upper Extremity Assessment: Defer to OT evaluation           Lower Extremity Assessment: RLE deficits/detail RLE Deficits / Details: Pt reports decreased sensation in RLE that  "comes and goes." currently decreased sensation in R foot       Communication   Communication: No difficulties  Cognition Arousal/Alertness: Awake/alert Behavior During Therapy: WFL for tasks assessed/performed Overall Cognitive Status: Within Functional Limits for tasks assessed                      General Comments General comments (skin integrity, edema, etc.): Decreased sensation in R foot. Pt reports decreased sensation that is intermittent in bilateral legs since a previous neck surgery. States she often trips over her own feet and has had to quit work since february due to a fall that injured her shoulder.    Exercises ROM for elbow, wrist and digits of operated UE: Supervision/safety (10 reps in supported standing)      Assessment/Plan    PT Assessment Patient needs continued PT services  PT Diagnosis Abnormality of gait;Difficulty walking;Acute pain   PT Problem List Decreased balance;Decreased mobility;Decreased knowledge of use of DME;Impaired sensation;Pain  PT Treatment Interventions DME instruction;Gait training;Stair training;Functional mobility training;Therapeutic activities;Balance training;Therapeutic exercise;Neuromuscular re-education;Patient/family education   PT Goals (Current goals can be found in the Care Plan section) Acute Rehab PT Goals Patient Stated Goal: to be able to take care of myself PT Goal Formulation: With patient Time For Goal Achievement: 01/14/14 Potential to Achieve Goals: Good    Frequency Min 2X/week   Barriers to discharge Decreased caregiver support Pt lives alone    Co-evaluation               End of Session Equipment Utilized During Treatment: Gait belt Activity Tolerance: Patient tolerated treatment well Patient left: in chair;with call bell/phone within reach Nurse Communication: Mobility status         Time: 1138-1201 PT Time Calculation (min): 23 min   Charges:   PT Evaluation $Initial PT  Evaluation Tier I: 1 Procedure PT Treatments $Gait Training: 8-22 mins   PT G CodesSunday Spillers:        Shelby Wade, Shelby CarolinaPT [phone]   Berton MountLogan S Jaycob Wade 01/07/2014, 1:22 PM

## 2014-01-08 NOTE — Progress Notes (Addendum)
Occupational Therapy Treatment Patient Details Name: Lorrin GoodellJudy Kydd MRN: [id number] DOB: 02-Nov-1950 Today's Date: 01/08/2014    History of present illness LEFT REVERSE SHOULDER ARTHROPLASTY    OT comments  Pt progressing. Pt performed ADLs and performed UE exercises.   Follow Up Recommendations  SNF    Equipment Recommendations  3 in 1 bedside comode;Tub/shower seat    Recommendations for Other Services      Precautions / Restrictions Precautions Precautions: Shoulder;Fall Shoulder Interventions: Off for dressing/bathing/exercises;At all times Precaution Comments: Reviewed precautions Required Braces or Orthoses: Sling Restrictions Weight Bearing Restrictions: Yes LUE Weight Bearing: Non weight bearing       Mobility Bed Mobility                  Transfers Overall transfer level: Needs assistance Equipment used: Straight cane Transfers: Sit to/from Stand Sit to Stand: Min guard         General transfer comment: cues for technique.    Balance                                   ADL Overall ADL's : Needs assistance/impaired     Grooming: Wash/dry face;Brushing hair;Set up;Supervision/safety;Standing   Upper Body Bathing: Minimal assitance;Sitting (under left armpit)   Lower Body Bathing: Min guard (washed peri area standing)   Upper Body Dressing : Minimal assistance;Sitting   Lower Body Dressing: Moderate assistance (sock-sitting and pulled pants up/down while standing)   Toilet Transfer: Min guard;Ambulation;Comfort height toilet;Grab bars (cane)   Toileting- Clothing Manipulation and Hygiene: Minimal assistance;Sit to/from stand       Functional mobility during ADLs: Min guard;Cane (ambulated in hallway) General ADL Comments: Pt performed ADLs during session. OT provided education and assisted as needed. Pt performed UE exercises. Total assistance to don sock-pt able to pull pants/underwear up and down with Min A during  session.      Vision                     Perception     Praxis      Cognition   Behavior During Therapy: [facility] for tasks assessed/performed Overall Cognitive Status: Within Functional Limits for tasks assessed                       Extremity/Trunk Assessment               Exercises Shoulder Exercises Elbow Flexion: AROM;Left;10 reps;Standing Elbow Extension: AROM;Left;10 reps;Standing Wrist Flexion: AROM;Left;10 reps;Standing Wrist Extension: AROM;Left;10 reps;Standing Digit Composite Flexion: AROM;Left;10 reps;Standing Composite Extension: AROM;Left;10 reps;Standing Neck Flexion: AROM;10 reps Neck Extension: AROM;10 reps Neck Lateral Flexion - Right: AROM;10 reps Neck Lateral Flexion - Left: AROM;10 reps Donning/doffing shirt without moving shoulder: Minimal assistance Method for sponge bathing under operated UE: Minimal assistance Donning/doffing sling/immobilizer: Minimal assistance Correct positioning of sling/immobilizer: Minimal assistance ROM for elbow, wrist and digits of operated UE: Supervision/safety Sling wearing schedule (on at all times/off for ADL's):  (educated) Proper positioning of operated UE when showering: Patient able to independently direct caregiver Positioning of UE while sleeping:  (educated)   Shoulder Instructions Shoulder Instructions Donning/doffing shirt without moving shoulder: Minimal assistance Method for sponge bathing under operated UE: Minimal assistance Donning/doffing sling/immobilizer: Minimal assistance Correct positioning of sling/immobilizer: Minimal assistance ROM for elbow, wrist and digits of operated UE: Supervision/safety Sling wearing schedule (on at all times/off for ADL's):  (educated) Proper positioning of  operated UE when showering: Patient able to independently direct caregiver Positioning of UE while sleeping:  (educated)     General Comments      Pertinent Vitals/ Pain      Pain 8/10.  Increased activity during session. Pt appeared comfortable in chair at end of session.  Home Living                                          Prior Functioning/Environment              Frequency Min 3X/week     Progress Toward Goals  OT Goals(current goals can now be found in the care plan section)  Progress towards OT goals: Progressing toward goals  Acute Rehab OT Goals Patient Stated Goal: not stated OT Goal Formulation: With patient Time For Goal Achievement: 01/14/14 Potential to Achieve Goals: Good ADL Goals Pt Will Perform Grooming: with set-up;with supervision;standing Pt Will Perform Upper Body Bathing: with set-up;with supervision;sitting;standing Pt Will Perform Lower Body Bathing: with set-up;with supervision;sit to/from stand Pt Will Perform Upper Body Dressing: with set-up;with supervision;sitting;standing Pt Will Perform Lower Body Dressing: with set-up;with supervision;sit to/from stand Pt Will Transfer to Toilet: with supervision;ambulating;bedside commode (over toilet) Pt Will Perform Toileting - Clothing Manipulation and hygiene: with supervision;sit to/from stand Pt/caregiver will Perform Home Exercise Program: Left upper extremity;With written HEP provided;With Supervision (per Dr. Veda Canninghandler's instructions) Additional ADL Goal #1: Pt will S for in and OOB for BADLs  Plan Discharge plan remains appropriate    Co-evaluation                 End of Session Equipment Utilized During Treatment: Gait belt;Other (comment) (cane; sling)   Activity Tolerance Patient tolerated treatment well   Patient Left in chair;with call bell/phone within reach   Nurse Communication Other (comment) (some stool when performing LB bathing)        Time: [id number] OT Time Calculation (min): 32 min  Charges: OT General Charges $OT Visit: 1 Procedure OT Treatments $Self Care/Home Management : 8-22 mins $Therapeutic Exercise: 8-22 mins  Earlie RavelingLindsey  L Tammy Ericsson OTR/L [phone] 01/08/2014, 4:12 PM

## 2014-01-08 NOTE — Progress Notes (Addendum)
Clinical Social Work Department CLINICAL SOCIAL WORK PLACEMENT NOTE 01/08/2014  Patient:  Shelby Wade,Shelby Wade  Account Number:  [id number] Admit date:  01/06/2014  Clinical Social Worker:  Jetta LoutBAILEY MORGAN, Theresia MajorsLCSWA  Date/time:  01/08/2014 05:27 PM  Clinical Social Work is seeking post-discharge placement for this patient at the following level of care:   SKILLED NURSING   (*CSW will update this form in Epic as items are completed)   01/07/2014  Patient/family provided with [facility] of Clinical [facility] Work's list of facilities offering this level of care within the geographic area requested by the patient (or if unable, by the patient's family).  01/07/2014  Patient/family informed of their freedom to choose among providers that offer the needed level of care, that participate in Medicare, Medicaid or managed care program needed by the patient, have an available bed and are willing to accept the patient.  01/07/2014  Patient/family informed of [facility]' ownership interest in [facility], as well as of the fact that they are under no obligation to receive care at this facility.  PASARR submitted to [employer] on 01/08/2014 PASARR number received from [employer] on 01/08/2014  FL2 transmitted to all facilities in geographic area requested by pt/family on  01/07/2014 FL2 transmitted to all facilities within larger geographic area on   Patient informed that his/her managed care company has contracts with or will negotiate with  certain facilities, including the following:     Patient/family informed of bed offers received:  01/10/2014 [facility] (chosen by [employer]) Physician recommends and patient chooses bed at    [facility] on  01/11/2014 Patient to be transferred to facility by worker's comp vendor   The following physician request were entered in Epic:   Additional Comments:

## 2014-01-08 NOTE — Progress Notes (Signed)
Physical Therapy Treatment Patient Details Name: Shelby GoodellJudy Wade MRN: [id number] DOB: May 11, 1951 Today's Date: 01/08/2014    History of Present Illness LEFT REVERSE SHOULDER ARTHROPLASTY     PT Comments    Patient still requiring assistance with mobility and ambulation. Patient fearful of falling. Stated that she has tripped up at home but normally grabs the wall or a chair at home. Educated on safety. Continue to recommend SNF for ongoing Physical Therapy.     Follow Up Recommendations        Equipment Recommendations  Cane    Recommendations for Other Services       Precautions / Restrictions Precautions Precautions: Shoulder;Fall Shoulder Interventions: Off for dressing/bathing/exercises;At all times Required Braces or Orthoses: Sling Restrictions LUE Weight Bearing: Non weight bearing    Mobility  Bed Mobility                  Transfers Overall transfer level: Needs assistance Equipment used: Straight cane   Sit to Stand: Min guard         General transfer comment: Pt requires cues for hand placement and technique to stand safely with single point cane. Continues to brace with LEs on the chair  Ambulation/Gait Ambulation/Gait assistance: Min guard Ambulation Distance (Feet): 150 Feet Assistive device: Straight cane Gait Pattern/deviations: Step-to pattern;Drifts right/left     General Gait Details: Patient still showing some staggering and concern with gait. patient guarded with ambulation.    Stairs            Wheelchair Mobility    Modified Rankin (Stroke Patients Only)       Balance     Sitting balance-Leahy Scale: Good       Standing balance-Leahy Scale: Poor                      Cognition Arousal/Alertness: Awake/alert Behavior During Therapy: WFL for tasks assessed/performed Overall Cognitive Status: Within Functional Limits for tasks assessed                      Exercises      General Comments         Pertinent Vitals/Pain Denied pain    Home Living                      Prior Function            PT Goals (current goals can now be found in the care plan section) Progress towards PT goals: Progressing toward goals    Frequency  Min 2X/week    PT Plan Current plan remains appropriate    Co-evaluation             End of Session Equipment Utilized During Treatment: Gait belt Activity Tolerance: Patient tolerated treatment well Patient left: in chair;with call bell/phone within reach     Time: 1010-1025 PT Time Calculation (min): 15 min  Charges:  $Gait Training: 8-22 mins                    G Codes:      Adline PotterJulia Elizabeth Robinette 01/08/2014, 10:29 AM  01/08/2014 Adline PotterJulia Elizabeth Robinette PTA [phone] pager [phone] office

## 2014-01-08 NOTE — Progress Notes (Signed)
Subjective: 2 Days Post-Op Procedure(s) (LRB): REVERSE SHOULDER ARTHROPLASTY (Left) Patient reports pain as 5 on 0-10 scale.  She lives alone.  She is awaiting coverage and placement in a skilled nursing facility.  She is taking by mouth and voiding okay.   Objective: Vital signs in last 24 hours: Temp:  [98.9 F (37.2 C)-99.9 F (37.7 C)] 99.9 F (37.7 C) (04/25 0521) Pulse Rate:  [77-106] 94 (04/25 0941) Resp:  [18] 18 (04/25 0521) BP: (96-138)/(55-110) 96/63 mmHg (04/25 0941) SpO2:  [92 %-96 %] 92 % (04/25 0521)  Intake/Output from previous day: 04/24 0701 - 04/25 0700 In: 600 [P.O.:600] Out: -  Intake/Output this shift: Total I/O In: 360 [P.O.:360] Out: -    Recent Labs  01/07/14 0355  HGB 11.9*    Recent Labs  01/07/14 0355  WBC 12.3*  RBC 3.78*  HCT 34.7*  PLT 243   No results found for this basename: NA, K, CL, CO2, BUN, CREATININE, GLUCOSE, CALCIUM,  in the last 72 hours No results found for this basename: LABPT, INR,  in the last 72 hours Left shoulder exam: Neurovascular intact Sensation intact distally Intact pulses distally Incision: dressing C/D/I Compartment soft  Assessment/Plan: 2 Days Post-Op Procedure(s) (LRB): REVERSE SHOULDER ARTHROPLASTY (Left) Plan: Continue PT/OT.  Range of motion of hand, wrist, and elbow. [facility].  Matthew FolksJames G Maximino Cozzolino 01/08/2014, 11:28 AM

## 2014-01-09 NOTE — Progress Notes (Signed)
Subjective: 3 Days Post-Op Procedure(s) (LRB): REVERSE SHOULDER ARTHROPLASTY (Left) Patient reports pain as 4 on 0-10 scale.   C/O swelling in both lower legs. No calf pain. Objective: Vital signs in last 24 hours: Temp:  [98.6 F (37 C)-99.1 F (37.3 C)] 99 F (37.2 C) (04/26 0639) Pulse Rate:  [83-110] 83 (04/26 0639) Resp:  [18] 18 (04/26 0639) BP: (95-117)/(53-58) 95/56 mmHg (04/26 0639) SpO2:  [95 %-96 %] 96 % (04/26 0639)  Intake/Output from previous day: 04/25 0701 - 04/26 0700 In: 1440 [P.O.:1440] Out: -  Intake/Output this shift:     Recent Labs  01/07/14 0355  HGB 11.9*    Recent Labs  01/07/14 0355  WBC 12.3*  RBC 3.78*  HCT 34.7*  PLT 243   No results found for this basename: NA, K, CL, CO2, BUN, CREATININE, GLUCOSE, CALCIUM,  in the last 72 hours No results found for this basename: LABPT, INR,  in the last 72 hours Left shoulder exam: Neurovascular intact Sensation intact distally Intact pulses distally Incision: no drainage No cellulitis present  Some edema in both ankles.  Calves soft.  Assessment/Plan: 3 Days Post-Op Procedure(s) (LRB): REVERSE SHOULDER ARTHROPLASTY (Left) Plan: Knee high TED hose for swelling in legs. Discharge to SNF tomorrow when arrangements made.  Matthew FolksJames G Ia Leeb 01/09/2014, 10:48 AM

## 2014-01-10 NOTE — Progress Notes (Signed)
Seen and agreed 01/10/2014 Julia Elizabeth Robinette PTA [phone] pager [phone] office    

## 2014-01-10 NOTE — Progress Notes (Signed)
Late Entry  Clinical Social Work Department BRIEF PSYCHOSOCIAL ASSESSMENT 01/10/2014  Patient:  Shelby Wade,Shelby Wade     Account Number:  [id number]     Admit date:  01/06/2014  Clinical Social Worker:  Harless NakayamaAMBELAL,Alexi Geibel, LCSWA  Date/Time:  01/07/2014 04:00 PM  Referred by:  Physician  Date Referred:  01/10/2014 Referred for  SNF Placement   Other Referral:   Interview type:  Patient Other interview type:   Spoke with pt and then pt daughter over the phone    PSYCHOSOCIAL DATA Living Status:  ALONE Admitted from facility:   Level of care:   Primary support name:  Rushie GoltzFaith [phone] Primary support relationship to patient:  CHILD, ADULT Degree of support available:   Pt has good famly support    CURRENT CONCERNS Current Concerns  Post-Acute Placement   Other Concerns:    SOCIAL WORK ASSESSMENT / PLAN CSW informed that pt will need SNF for safety concerns. CSW first contacted Marylene LandAngela, Sports coachcase manager for pt workers comp (see case management note for contact information), and confirmed pt has been approved for SNF. CSW spoke with pt daughter over the phone as she requested call as soon as possible. CSW briefly explained SNF referral process to pt daughter. Pt daughter [facility]. Pt daughter had no preferences but informed CSW that pt may.    CSW spoke with pt about SNF. Pt reports not being able to arrange to have support when she returns home and agrees with recommendation. Pt reports no preferences but did ask to have facility as close as possible to her home so her daughter can visit. CSW explained referral process and pt is agreeable to [facility]. CSW also spoke with pt about potential need to choose first available facility in order to allow time for facility and workers comp to arrange payment. Pt very understanding of this and asked for CSW to please update as bed offers became available.   Assessment/plan status:   Psychosocial Support/Ongoing Assessment of Needs Other assessment/ plan:   Information/referral to community resources:   SNF list to be provided with bed offers    PATIENT'S/FAMILY'S RESPONSE TO PLAN OF CARE: Pt is agreeable to SNF       Brooklyn Jeff, LCSWA [phone]

## 2014-01-10 NOTE — Progress Notes (Signed)
PATIENT ID: Shelby Wade   4 Days Post-Op Procedure(s) (LRB): REVERSE SHOULDER ARTHROPLASTY (Left)  Subjective: Reports feeling well today. Ready for discharge. Notices swelling in legs have improved overnight with TED stockings. Pain controlled with oral medication. No other complaints or concerns.   Objective:  Filed Vitals:   01/10/14 0532  BP: 106/46  Pulse: 76  Temp: 97.9 F (36.6 C)  Resp: 18     Awake, alert, orientated R UE dressing c/d/i Incision benign, no swelling erythema, warmth Wiggles fingers, distally NVI, minimal swelling distally  Labs:  No results found for this basename: HGB,  in the last 72 hoursNo results found for this basename: WBC, RBC, HCT, PLT,  in the last 72 hoursNo results found for this basename: NA, K, CL, CO2, BUN, CREATININE, GLUCOSE, CALCIUM,  in the last 72 hours  Assessment and Plan: Plan to d/c to SNF today, reccommended by PT for balance/gait problems Social worker on Garment/textile technologistboard Percocet for outpatient pain rx in addition to home baseline oxycontin, script in chart  VTE proph: ASA 325mg  BID, SCDs

## 2014-01-10 NOTE — Progress Notes (Signed)
Occupational Therapy Treatment Patient Details Name: Shelby GoodellJudy Wade MRN: [id number] DOB: 1951/03/23 Today's Date: 01/10/2014    History of present illness LEFT REVERSE SHOULDER ARTHROPLASTY    OT comments  This 63 yo female admitted with above presents to acute OT making progress. Will continue to benefit from acute OT with follow up at SNF to get to a Mod I level to return home.  Follow Up Recommendations  SNF    Equipment Recommendations  3 in 1 bedside comode;Tub/shower seat       Precautions / Restrictions Precautions Precautions: Shoulder;Fall Shoulder Interventions: Off for dressing/bathing/exercises;At all times Required Braces or Orthoses: Sling Restrictions Weight Bearing Restrictions: Yes LUE Weight Bearing: Non weight bearing       Mobility Bed Mobility               General bed mobility comments: Pt up in recliner upon arrival  Transfers Overall transfer level: Needs assistance Equipment used: Straight cane Transfers: Sit to/from Stand Sit to Stand: Min guard              Balance Overall balance assessment: Needs assistance Sitting-balance support: Single extremity supported;Feet supported Sitting balance-Leahy Scale: Good     Standing balance support: Single extremity supported Standing balance-Leahy Scale: Fair                     ADL       Grooming: Oral care;Set up;Supervision/safety;Standing                   Toilet Transfer: Min guard;Ambulation;Comfort height toilet;Grab bars   Toileting- Clothing Manipulation and Hygiene: Min guard;Sit to/from stand       Functional mobility during ADLs: Min guard (with SPC) General ADL Comments: Pt S for doffing/donning LUE sling by laying out her sling on countertop and then placing her arm in sling and keeping arm propped on countertop until she can get the strap to pull around her neck. Pt ambulated around 1/3 of unit at min guard A with SPC                Cognition    Behavior During Therapy: [facility] for tasks assessed/performed Overall Cognitive Status: Within Functional Limits for tasks assessed                         Exercises Other Exercises Other Exercises: Pt performed 10 reps of AROM of elbow in standing           Pertinent Vitals/ Pain       No c/o pain         Frequency Min 3X/week     Progress Toward Goals  OT Goals(current goals can now be found in the care plan section)  Progress towards OT goals: Progressing toward goals     Plan Discharge plan remains appropriate       End of Session Equipment Utilized During Treatment:  (sling , SPC)   Activity Tolerance Patient tolerated treatment well   Patient Left in chair;with call bell/phone within reach   Nurse Communication Patient requests pain meds        Time: [id number] OT Time Calculation (min): 20 min  Charges: OT General Charges $OT Visit: 1 Procedure OT Treatments $Self Care/Home Management : 8-22 mins  Shelby Wade [phone] 01/10/2014, 2:11 PM

## 2014-01-10 NOTE — Discharge Summary (Signed)
Patient ID: Shelby Wade MRN: [id number] DOB/AGE: 61-Aug-1952 61 y.o.  Admit date: 01/06/2014 Discharge date: 01/10/2014  Admission Diagnoses:  Active Problems:   Rotator cuff tear arthropathy   Discharge Diagnoses:  Same  Past Medical History  Diagnosis Date  . Hypertension   . Asthma   . Hypothyroidism   . COPD (chronic obstructive pulmonary disease)   . Depression   . Headache(784.0)     PMH :  cluster migraines  . Numbness and tingling     in all extremities    Surgeries: Procedure(s): REVERSE SHOULDER ARTHROPLASTY on 01/06/2014   Consultants:    Discharged Condition: Improved  Hospital Course: Shelby Wade is an 63 y.o. female who was admitted 01/06/2014 for operative treatment of rotator cuff tear arthropathy .Fall at work with L shoulder dislocation, massive RCT. Irrepairable tear with early DJD. Pain and dysfunction interfere with quality of life.  Patient has severe unremitting pain that affects sleep, daily activities, and work/hobbies. After pre-op clearance the patient was taken to the operating room on 01/06/2014 and underwent  Procedure(s): REVERSE SHOULDER ARTHROPLASTY.    Patient was given perioperative antibiotics: Anti-infectives   Start     Dose/Rate Route Frequency Ordered Stop   01/06/14 1930  vancomycin (VANCOCIN) IVPB 1000 mg/200 mL premix     1,000 mg 200 mL/hr over 60 Minutes Intravenous Every 12 hours 01/06/14 1311 01/06/14 2008   01/06/14 0600  ceFAZolin (ANCEF) IVPB 2 g/50 mL premix  Status:  Discontinued     2 g 100 mL/hr over 30 Minutes Intravenous On call to O.R. 01/05/14 1425 01/05/14 1425   01/06/14 0600  vancomycin (VANCOCIN) IVPB 1000 mg/200 mL premix     1,000 mg 200 mL/hr over 60 Minutes Intravenous  Once 01/05/14 1426 01/06/14 0730       Patient was given sequential compression devices, early ambulation, and ASA 325mg  BID to prevent DVT.  Patient benefited maximally from hospital stay and there were no complications.  OT and PT  reccommended d/c to SNF due to risk of falls, additional days in house for gait training/ambulation.   Recent vital signs: Patient Vitals for the past 24 hrs:  BP Temp Temp src Pulse Resp SpO2  01/10/14 1524 124/54 mmHg 98.6 F (37 C) - 90 18 99 %  01/10/14 1054 113/49 mmHg - - 78 - -  01/10/14 0744 - - - - - 98 %  01/10/14 0532 106/46 mmHg 97.9 F (36.6 C) Oral 76 18 98 %  01/09/14 2221 101/57 mmHg 98 F (36.7 C) Oral 83 18 98 %     Recent laboratory studies: No results found for this basename: WBC, HGB, HCT, PLT, NA, K, CL, CO2, BUN, CREATININE, GLUCOSE, PT, INR, CALCIUM, 2,  in the last 72 hours   Discharge Medications:     Medication List         albuterol 108 (90 BASE) MCG/ACT inhaler  Commonly known as:  PROVENTIL HFA;VENTOLIN HFA  Inhale 2 puffs into the lungs every 6 (six) hours as needed for wheezing or shortness of breath.     docusate sodium 100 MG capsule  Commonly known as:  COLACE  Take 1 capsule (100 mg total) by mouth 3 (three) times daily as needed.     levothyroxine 175 MCG tablet  Commonly known as:  SYNTHROID, LEVOTHROID  Take 175 mcg by mouth daily before breakfast.     lisinopril-hydrochlorothiazide 10-12.5 MG per tablet  Commonly known as:  PRINZIDE,ZESTORETIC  Take 1 tablet by mouth  daily.     oxyCODONE-acetaminophen 5-325 MG per tablet  Commonly known as:  ROXICET  Take 1-2 tablets by mouth every 4 (four) hours as needed for severe pain.     OXYCONTIN 60 MG T12a  Generic drug:  OxyCODONE HCl ER  Take 60 mg by mouth 2 (two) times daily.     pregabalin 150 MG capsule  Commonly known as:  LYRICA  Take 150 mg by mouth 2 (two) times daily.        Diagnostic Studies: Dg Chest 2 View  12/29/2013   CLINICAL DATA:  Pre op  EXAM: CHEST  2 VIEW  COMPARISON:  None.  FINDINGS: The heart size and mediastinal contours are within normal limits. Both lungs are clear. There is flattening of the hemidiaphragms. There is dextroscoliosis within the thoracic  spine. Degenerative disc disease changes are appreciated.  IMPRESSION: No active cardiopulmonary disease.  COPD.   Electronically Signed   By: Salome HolmesHector  Cooper M.D.   On: 12/29/2013 10:17   Dg Shoulder Left Port  01/06/2014   CLINICAL DATA:  Shoulder replacement left  EXAM: PORTABLE LEFT SHOULDER - 2+ VIEW  COMPARISON:  Portable exam 1020 hr without priors for comparison  FINDINGS: Reverse left shoulder prosthesis with components in expected positions.  No acute fracture, dislocation or bone destruction.  Diffuse osseous demineralization.  Visualized ribs intact.  Overlying surgical drain at left shoulder.  IMPRESSION: Left shoulder reverse prosthesis without acute abnormalities.   Electronically Signed   By: Ulyses SouthwardMark  Boles M.D.   On: 01/06/2014 13:46    Disposition: SNF      Discharge Orders   Future Orders Complete By Expires   Call MD / Call 911  As directed    Call MD / Call 911  As directed    Constipation Prevention  As directed    Constipation Prevention  As directed    Diet - low sodium heart healthy  As directed    Diet - low sodium heart healthy  As directed    Increase activity slowly as tolerated  As directed    Increase activity slowly as tolerated  As directed       Follow-up Information   Follow up with Mable ParisHANDLER,JUSTIN WILLIAM, MD. Schedule an appointment as soon as possible for a visit in 2 weeks.   Specialty:  Orthopedic Surgery   Contact information:   [address] [place] [id number] [phone]        Signed: Jiles HaroldDanielle Daxtin Leiker 01/10/2014, 3:53 PM

## 2014-01-10 NOTE — Progress Notes (Addendum)
CSW (Clinical Child psychotherapistocial Worker) continues to work with workers comp and facilities. Currently two facilities [facility] and pt place of employment are working on cost and providing this estimate to [employer]'s comp. CSW working closely with Orbie PyoJenny Fogle [phone]) at [employer] (worker's comp) to get safe dc plan in place for pt. Per Boneta LucksJenny, likely that she will have facility choice tomorrow.  CSW updated pt today on situation. Pt tearful and [facility]. CSW to continue to work with pt and explain that ultimately worker's comp will make decision on which facility. Pt inquiring how her place of employment got her information. CSW unsure of place of [place] [facility] [place] that pt originally agreed to have referral go out to. If not, CSW unsure of how they received pt information.  Vannessa Godown, LCSWA [phone]

## 2014-01-10 NOTE — Progress Notes (Signed)
Physical Therapy Treatment Patient Details Name: Shelby GoodellJudy Wade MRN: [id number] DOB: 1951-02-08 Today's Date: 01/10/2014    History of Present Illness LEFT REVERSE SHOULDER ARTHROPLASTY     PT Comments    Patient is progressing towards goals but requires A for safety with mobility. CSW in hall to discuss d/c destination. Patient will discuss with daughter prior to discussion. Continue plan of care to increase independence.    Follow Up Recommendations  SNF     Equipment Recommendations  Cane    Recommendations for Other Services       Precautions / Restrictions Precautions Precautions: Shoulder;Fall Shoulder Interventions: Off for dressing/bathing/exercises;At all times Required Braces or Orthoses: Sling Restrictions Weight Bearing Restrictions: Yes LUE Weight Bearing: Non weight bearing    Mobility  Bed Mobility               General bed mobility comments: Pt up in recliner upon arrival  Transfers Overall transfer level: Needs assistance Equipment used: Straight cane Transfers: Sit to/from Stand Sit to Stand: Min guard         General transfer comment: cues for technique.  Ambulation/Gait Ambulation/Gait assistance: Min guard Ambulation Distance (Feet): 150 Feet Assistive device: Straight cane Gait Pattern/deviations: Step-to pattern;Drifts right/left;Decreased stride length     General Gait Details: Patient required A with manuevering safely in hall.    Stairs            Wheelchair Mobility    Modified Rankin (Stroke Patients Only)       Balance Overall balance assessment: Needs assistance Sitting-balance support: Single extremity supported;Feet supported Sitting balance-Leahy Scale: Good     Standing balance support: Single extremity supported Standing balance-Leahy Scale: Fair                      Cognition Arousal/Alertness: Awake/alert Behavior During Therapy: WFL for tasks assessed/performed Overall Cognitive  Status: Within Functional Limits for tasks assessed                      Exercises Other Exercises Other Exercises: Pt performed 10 reps of AROM of elbow in standing    General Comments        Pertinent Vitals/Pain Patient reports 6/10 pain level in shoulder post therapy. Patient was repositioned for comfort.     Home Living                      Prior Function            PT Goals (current goals can now be found in the care plan section) Progress towards PT goals: Progressing toward goals    Frequency  Min 2X/week    PT Plan Current plan remains appropriate    Co-evaluation             End of Session Equipment Utilized During Treatment: Gait belt Activity Tolerance: Patient tolerated treatment well Patient left: in chair;with call bell/phone within reach     Time: [id number] PT Time Calculation (min): 12 min  Charges:                       G Codes:      Alexzandrea Normington L Sui Kasparek, SPTA 01/10/2014, 3:18 PM

## 2014-01-10 NOTE — Progress Notes (Signed)
CSW (Clinical Child psychotherapistocial Worker) working on finding placement for pt. CSW has called and spoken with or left voicemail for all known facilities [place] [facility] [place]. Currently, [facility] is working on Museum/gallery exhibitions officercontact worker's comp to confirm billing/payment information.   Hortensia Duffin, LCSWA [phone]

## 2014-01-11 NOTE — Progress Notes (Signed)
Occupational Therapy Treatment Patient Details Name: Lorrin GoodellJudy Radney MRN: [id number] DOB: December 20, 1950 Today's Date: 01/11/2014    History of present illness LEFT REVERSE SHOULDER ARTHROPLASTY    OT comments  Eager for participation in skilled OT.  Able to complete LB self care, toileting, and tub transfer with s to min a.  Is eager for d/c home and to live a "more active life".  Tearful during session about her previous quality of life.  Provided emotional support.    Follow Up Recommendations  SNF    Equipment Recommendations  3 in 1 bedside comode;Tub/shower seat          Precautions / Restrictions Precautions Precautions: Shoulder;Fall Shoulder Interventions: Off for dressing/bathing/exercises;At all times Precaution Comments: Reviewed precautions Required Braces or Orthoses: Sling Restrictions Weight Bearing Restrictions: Yes LUE Weight Bearing: Non weight bearing       Mobility Bed Mobility Overal bed mobility: Modified Independent             General bed mobility comments: able to transition from supine to sit with a rocking motion. states that is how she got oob prior to sx. also  Transfers   Equipment used: Straight cane Transfers: Sit to/from Stand Sit to Stand: Supervision                                                 ADL Overall ADL's : Needs assistance/impaired     Grooming: Supervision/safety;Standing               Lower Body Dressing: Supervision/safety;Sitting/lateral leans;Sit to/from stand Lower Body Dressing Details (indicate cue type and reason): able to don/doff socks in sitting, crossing legs over at the knees  Toilet Transfer: Supervision/safety;Ambulation;Comfort height toilet;Grab bars   Toileting- Clothing Manipulation and Hygiene: Supervision/safety;Sit to/from stand   Tub/ Shower Transfer: Tub Scientist, clinical (histocompatibility and immunogenetics)transfer;Minimal assistance Tub/Shower Transfer Details (indicate cue type and reason): has a grab bar she can  use in tub, tub has sliding doors.  states she "never" completes tub tansfers by herself and has grand son who assists her Functional mobility during ADLs: Supervision/safety;Cane General ADL Comments: able to complete LB dressing, and all components of toileting with no LOB or safety concerns noted                                                                                                Pertinent Vitals/ Pain       No c/o pain                                                          Frequency Min 3X/week     Progress Toward Goals  OT Goals(current goals can now be found in the care plan section)  Progress towards OT goals: Progressing toward goals  Plan Discharge plan remains appropriate                     End of Session     Activity Tolerance Patient tolerated treatment well   Patient Left in chair;with call bell/phone within reach             Time: 0855-0921 OT Time Calculation (min): 26 min  Charges: OT General Charges $OT Visit: 1 Procedure OT Treatments $Self Care/Home Management : 23-37 mins  Earvin HansenJennifer Lorraine Myrth Dahan, COTA/L 01/11/2014, 10:44 AM

## 2014-01-11 NOTE — Progress Notes (Signed)
PATIENT ID: Shelby GoodellJudy Wade   5 Days Post-Op Procedure(s) (LRB): REVERSE SHOULDER ARTHROPLASTY (Left)  Subjective: Patient states she is going lonely and hoping for d/c today. No other complaints or concerns.   Objective:  Filed Vitals:   01/11/14 0642  BP: 148/75  Pulse: 89  Temp: 98.6 F (37 C)  Resp: 18    Awake, alert, orientated  R UE dressing c/d/i  Incision benign, no swelling erythema, warmth  Wiggles fingers, distally NVI, minimal swelling distally    Labs:  No results found for this basename: HGB,  in the last 72 hoursNo results found for this basename: WBC, RBC, HCT, PLT,  in the last 72 hoursNo results found for this basename: NA, K, CL, CO2, BUN, CREATININE, GLUCOSE, CALCIUM,  in the last 72 hours  Assessment and Plan: Plan to d/c to SNF today, reccommended by PT for balance/gait problems  Social worker on board, waiting for work Product/process development scientistcomp approval Percocet for outpatient pain rx in addition to home baseline oxycontin, script in chart  VTE proph: ASA325mg  BID, SCDs

## 2014-01-11 NOTE — Progress Notes (Signed)
CSW (Clinical Child psychotherapistocial Worker) received call from [employer] agent informing CSW that they have worked out payment with [facility]. CSW asked agent about transportation. Worker's comp agent to arrange transport for pt to be picked up at 1pm. CSW provided with nursing station phone number for drive to call when he has arrived at hospital.   CSW spoke with pt and informed of facility. Pt understanding that CSW had no control over this decision. CSW explained again that worker's comp asked both facilities to inform of costs and they chose facility based on that. CSW offered support to pt as pt did become upset yesterday with possibility of having to dc to this facility (work place). Today, pt verbalized that she was "okay". Pt agreeable to discharge plan and aware of pick-up time. CSW placed pt discharge packet with shadow chart. Pt nurse aware of transportation arrangement and that pt needs to have dc packet go with her. At this time, pt has no further CSW needs. CSW signing off.  Zenya Hickam, LCSWA [phone]

## 2014-08-01 IMAGING — CR DG SHOULDER 1V*L*
2 series · 2 of 2 positions shown · non-contrast
Comparison: Portable exam 2878 hr without priors for comparison

CLINICAL DATA: Shoulder replacement left

EXAM:
PORTABLE LEFT SHOULDER - 2+ VIEW

[AP (1 of 2)]
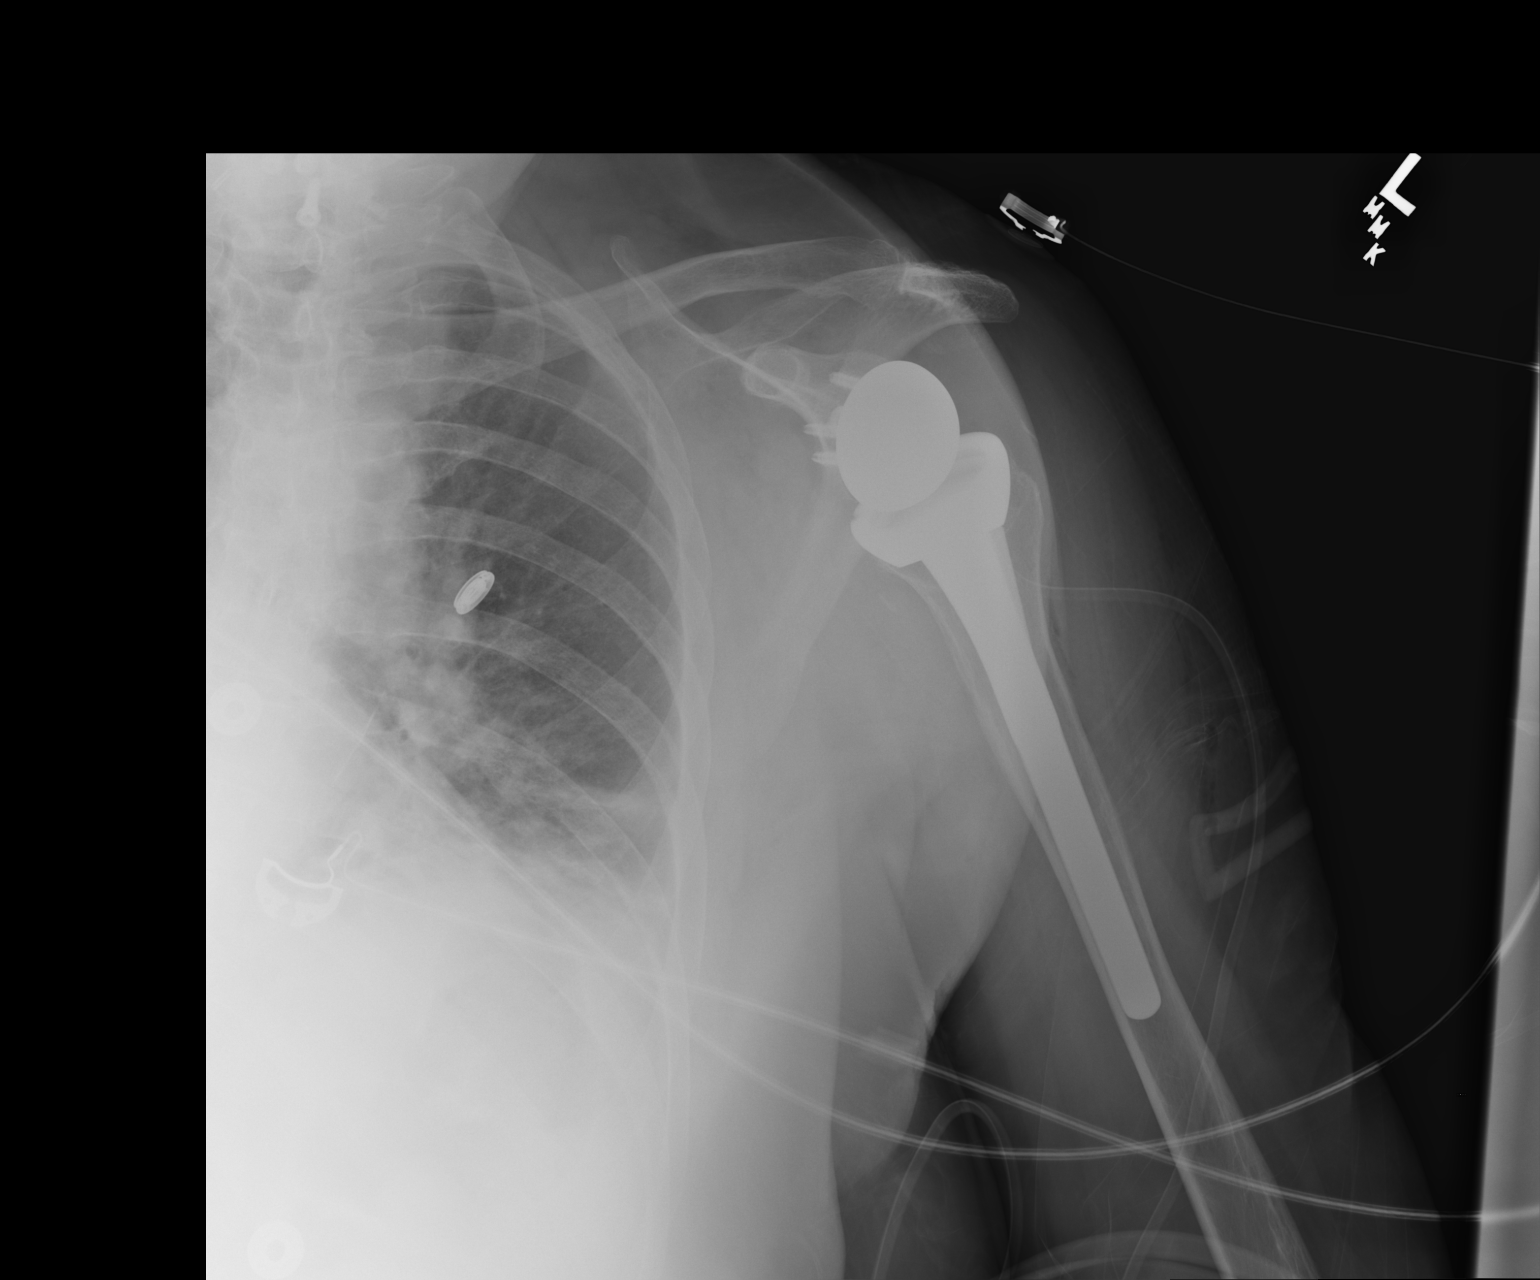

[AP (2 of 2)]
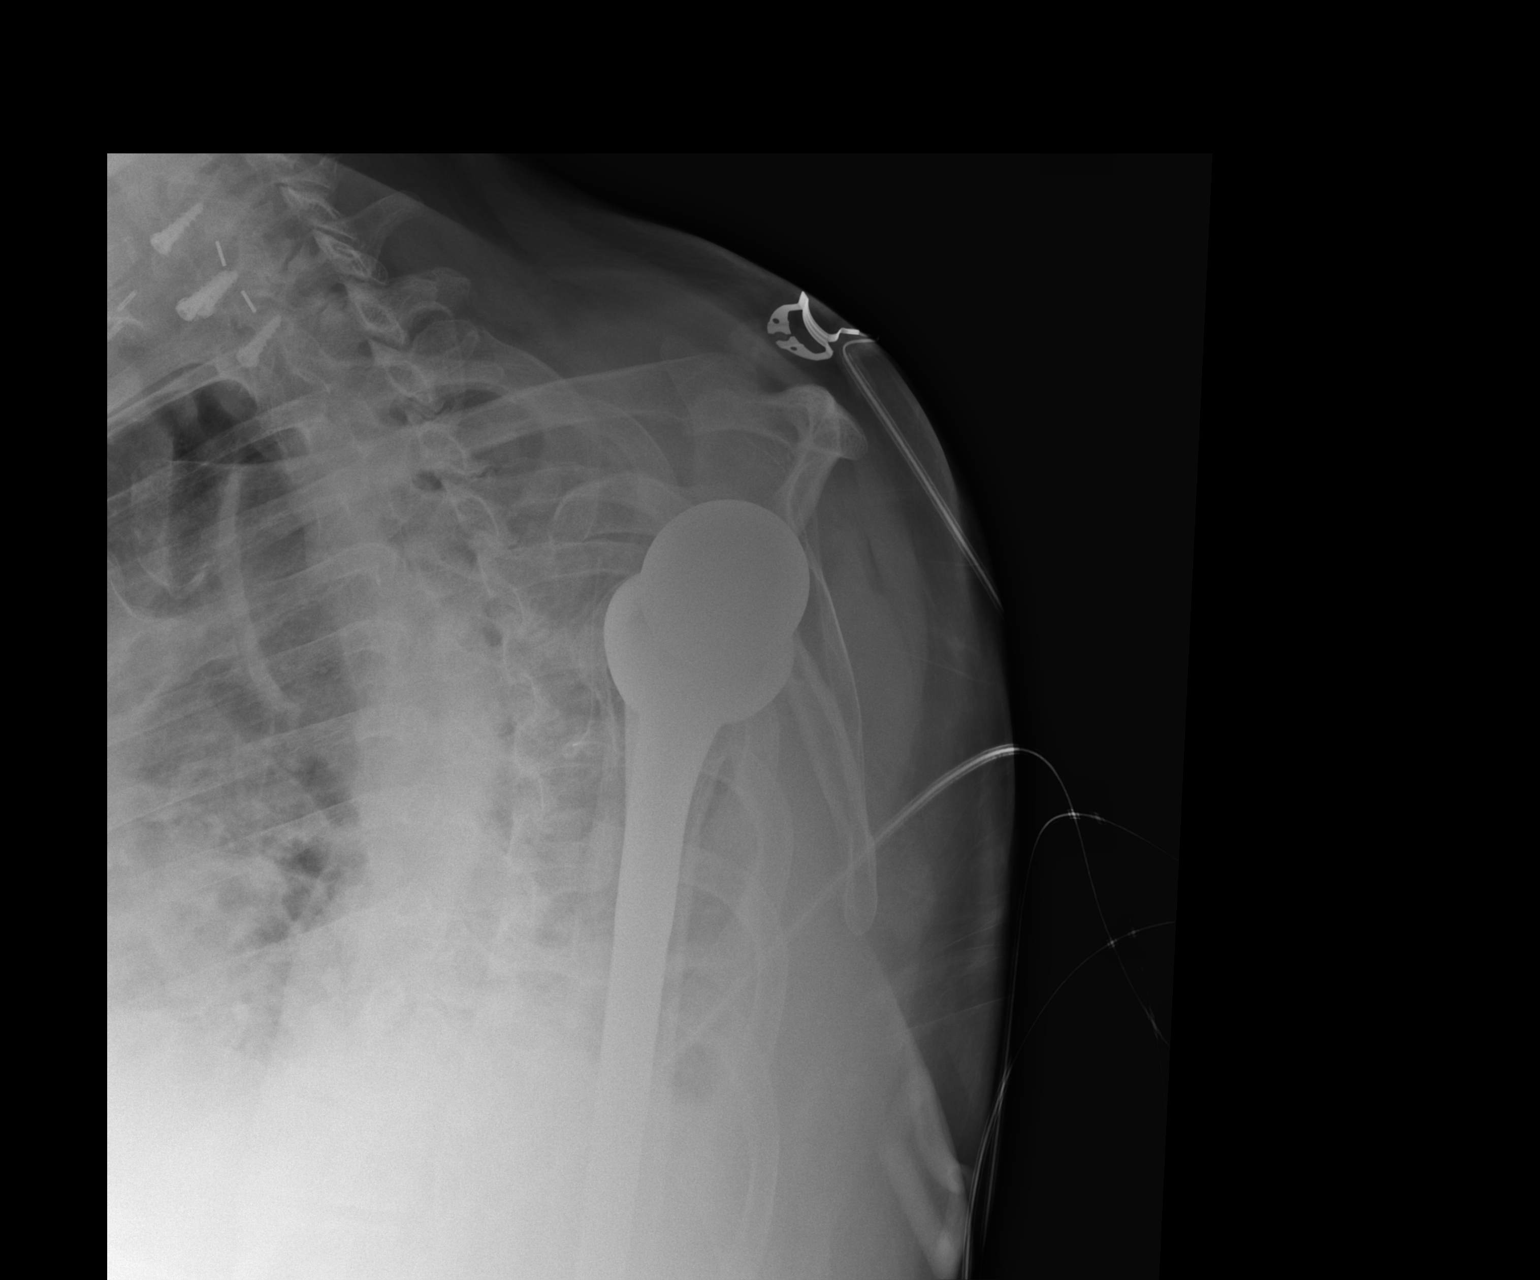

[2 of 2 positions shown; findings below may reference images not displayed]

FINDINGS: Reverse left shoulder prosthesis with components in expected
positions.

No acute fracture, dislocation or bone destruction.

Diffuse osseous demineralization.

Visualized ribs intact.

Overlying surgical drain at left shoulder.
IMPRESSION: Left shoulder reverse prosthesis without acute abnormalities.
